# Patient Record
Sex: Female | Born: 1950
Health system: Southern US, Community
[De-identification: ages and names within clinical notes are randomized; demographics above are authoritative.]

## PROBLEM LIST (undated history)

## (undated) DIAGNOSIS — Z923 Personal history of irradiation: Secondary | ICD-10-CM

## (undated) DIAGNOSIS — C801 Malignant (primary) neoplasm, unspecified: Secondary | ICD-10-CM

## (undated) DIAGNOSIS — E079 Disorder of thyroid, unspecified: Secondary | ICD-10-CM

## (undated) DIAGNOSIS — D6800 Von Willebrand disease, unspecified: Secondary | ICD-10-CM

## (undated) HISTORY — PX: THYROIDECTOMY: SHX17

## (undated) HISTORY — PX: BREAST SURGERY: SHX581

## (undated) HISTORY — PX: ABDOMINAL HYSTERECTOMY: SHX81

## (undated) HISTORY — PX: OTHER SURGICAL HISTORY: SHX169

---

## 1998-11-08 ENCOUNTER — Ambulatory Visit (HOSPITAL_COMMUNITY): Admission: RE | Admit: 1998-11-08 | Discharge: 1998-11-08 | Payer: Self-pay | Admitting: Family Medicine

## 1998-11-08 ENCOUNTER — Encounter: Payer: Self-pay | Admitting: Family Medicine

## 1998-11-14 ENCOUNTER — Ambulatory Visit (HOSPITAL_COMMUNITY): Admission: RE | Admit: 1998-11-14 | Discharge: 1998-11-14 | Payer: Self-pay | Admitting: Family Medicine

## 1998-11-14 ENCOUNTER — Encounter: Payer: Self-pay | Admitting: Family Medicine

## 1999-07-07 ENCOUNTER — Other Ambulatory Visit: Admission: RE | Admit: 1999-07-07 | Discharge: 1999-07-07 | Payer: Self-pay | Admitting: Family Medicine

## 1999-08-04 ENCOUNTER — Ambulatory Visit (HOSPITAL_COMMUNITY): Admission: RE | Admit: 1999-08-04 | Discharge: 1999-08-04 | Payer: Self-pay | Admitting: Family Medicine

## 1999-08-04 ENCOUNTER — Encounter: Payer: Self-pay | Admitting: Family Medicine

## 2002-05-08 ENCOUNTER — Other Ambulatory Visit: Admission: RE | Admit: 2002-05-08 | Discharge: 2002-05-08 | Payer: Self-pay | Admitting: Obstetrics and Gynecology

## 2003-09-27 ENCOUNTER — Ambulatory Visit (HOSPITAL_COMMUNITY): Admission: RE | Admit: 2003-09-27 | Discharge: 2003-09-27 | Payer: Self-pay | Admitting: *Deleted

## 2003-09-27 ENCOUNTER — Encounter (INDEPENDENT_AMBULATORY_CARE_PROVIDER_SITE_OTHER): Payer: Self-pay | Admitting: Specialist

## 2003-09-28 ENCOUNTER — Other Ambulatory Visit: Admission: RE | Admit: 2003-09-28 | Discharge: 2003-09-28 | Payer: Self-pay | Admitting: Obstetrics and Gynecology

## 2006-12-20 ENCOUNTER — Encounter (INDEPENDENT_AMBULATORY_CARE_PROVIDER_SITE_OTHER): Payer: Self-pay | Admitting: Specialist

## 2006-12-20 ENCOUNTER — Encounter (INDEPENDENT_AMBULATORY_CARE_PROVIDER_SITE_OTHER): Payer: Self-pay | Admitting: Diagnostic Radiology

## 2006-12-20 ENCOUNTER — Encounter: Admission: RE | Admit: 2006-12-20 | Discharge: 2006-12-20 | Payer: Self-pay | Admitting: Obstetrics and Gynecology

## 2007-01-10 ENCOUNTER — Encounter: Admission: RE | Admit: 2007-01-10 | Discharge: 2007-01-10 | Payer: Self-pay | Admitting: Obstetrics and Gynecology

## 2007-02-10 ENCOUNTER — Ambulatory Visit (HOSPITAL_BASED_OUTPATIENT_CLINIC_OR_DEPARTMENT_OTHER): Admission: RE | Admit: 2007-02-10 | Discharge: 2007-02-10 | Payer: Self-pay | Admitting: General Surgery

## 2007-02-10 ENCOUNTER — Encounter (INDEPENDENT_AMBULATORY_CARE_PROVIDER_SITE_OTHER): Payer: Self-pay | Admitting: Specialist

## 2007-02-10 ENCOUNTER — Encounter: Admission: RE | Admit: 2007-02-10 | Discharge: 2007-02-10 | Payer: Self-pay | Admitting: General Surgery

## 2007-02-20 ENCOUNTER — Ambulatory Visit: Payer: Self-pay | Admitting: Oncology

## 2007-02-25 ENCOUNTER — Ambulatory Visit: Admission: RE | Admit: 2007-02-25 | Discharge: 2007-05-17 | Payer: Self-pay | Admitting: Radiation Oncology

## 2007-03-27 LAB — CBC WITH DIFFERENTIAL/PLATELET
BASO%: 0.4 % (ref 0.0–2.0)
LYMPH%: 35.7 % (ref 14.0–48.0)
MCHC: 35.2 g/dL (ref 32.0–36.0)
MONO#: 0.3 10*3/uL (ref 0.1–0.9)
MONO%: 5.5 % (ref 0.0–13.0)
Platelets: 305 10*3/uL (ref 145–400)
RBC: 4.79 10*6/uL (ref 3.70–5.32)
RDW: 13.9 % (ref 11.3–14.5)
WBC: 5.4 10*3/uL (ref 3.9–10.0)

## 2007-03-27 LAB — COMPREHENSIVE METABOLIC PANEL
ALT: 18 U/L (ref 0–35)
Alkaline Phosphatase: 99 U/L (ref 39–117)
Potassium: 3.5 mEq/L (ref 3.5–5.3)
Sodium: 141 mEq/L (ref 135–145)
Total Bilirubin: 0.4 mg/dL (ref 0.3–1.2)
Total Protein: 7.4 g/dL (ref 6.0–8.3)

## 2007-04-07 LAB — PROTHROMBIN TIME: INR: 0.9 (ref 0.0–1.5)

## 2007-04-17 ENCOUNTER — Ambulatory Visit: Payer: Self-pay | Admitting: Oncology

## 2007-04-23 LAB — OTHER SOLSTAS TEST

## 2007-04-24 ENCOUNTER — Emergency Department (HOSPITAL_COMMUNITY): Admission: EM | Admit: 2007-04-24 | Discharge: 2007-04-24 | Payer: Self-pay | Admitting: Emergency Medicine

## 2007-04-24 LAB — VON WILLEBRAND FACTOR MULTIMER: Factor-VIII Activity: 36 % — ABNORMAL LOW (ref 75–150)

## 2007-05-21 ENCOUNTER — Other Ambulatory Visit: Admission: RE | Admit: 2007-05-21 | Discharge: 2007-05-21 | Payer: Self-pay | Admitting: Family Medicine

## 2007-06-25 ENCOUNTER — Ambulatory Visit: Payer: Self-pay | Admitting: Oncology

## 2007-12-10 ENCOUNTER — Ambulatory Visit: Payer: Self-pay | Admitting: Oncology

## 2007-12-12 ENCOUNTER — Encounter: Admission: RE | Admit: 2007-12-12 | Discharge: 2007-12-12 | Payer: Self-pay | Admitting: Surgery

## 2007-12-15 LAB — VON WILLEBRAND PANEL
Factor-VIII Activity: 127 % (ref 75–150)
Ristocetin-Cofactor: <20 % — ABNORMAL LOW (ref 50–150)

## 2007-12-23 ENCOUNTER — Encounter: Admission: RE | Admit: 2007-12-23 | Discharge: 2007-12-23 | Payer: Self-pay | Admitting: Surgery

## 2008-01-08 ENCOUNTER — Encounter (INDEPENDENT_AMBULATORY_CARE_PROVIDER_SITE_OTHER): Payer: Self-pay | Admitting: Surgery

## 2008-01-09 ENCOUNTER — Ambulatory Visit: Payer: Self-pay | Admitting: Oncology

## 2008-01-09 ENCOUNTER — Inpatient Hospital Stay (HOSPITAL_COMMUNITY): Admission: RE | Admit: 2008-01-09 | Discharge: 2008-01-10 | Payer: Self-pay | Admitting: Surgery

## 2008-02-10 ENCOUNTER — Encounter (HOSPITAL_COMMUNITY): Admission: RE | Admit: 2008-02-10 | Discharge: 2008-02-27 | Payer: Self-pay | Admitting: Endocrinology

## 2008-03-16 IMAGING — US US SOFT TISSUE HEAD/NECK
1 series · 14 of 25 positions shown · non-contrast
Comparison: NONE

CLINICAL DATA: Goiter noted on the recent CT study. 

THYROID ULTRASOUND

[Series 1: us thyroid · 0.12mm/px · 14 of 38 slices shown]
[im 1/38]
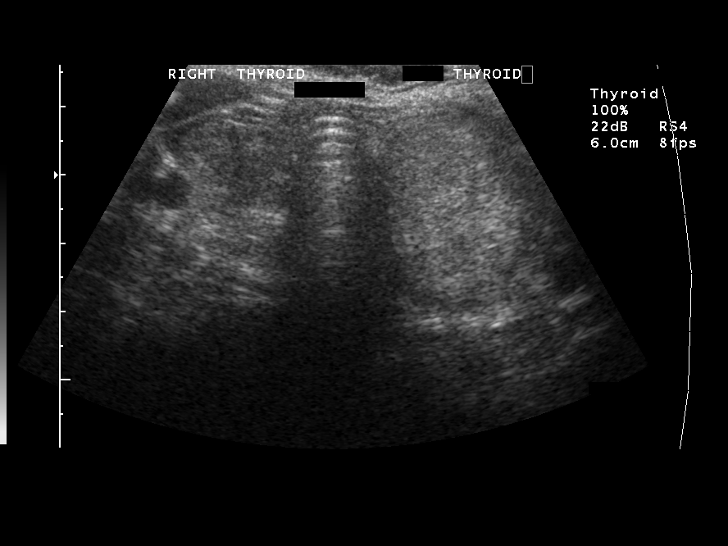
[im 4/38]
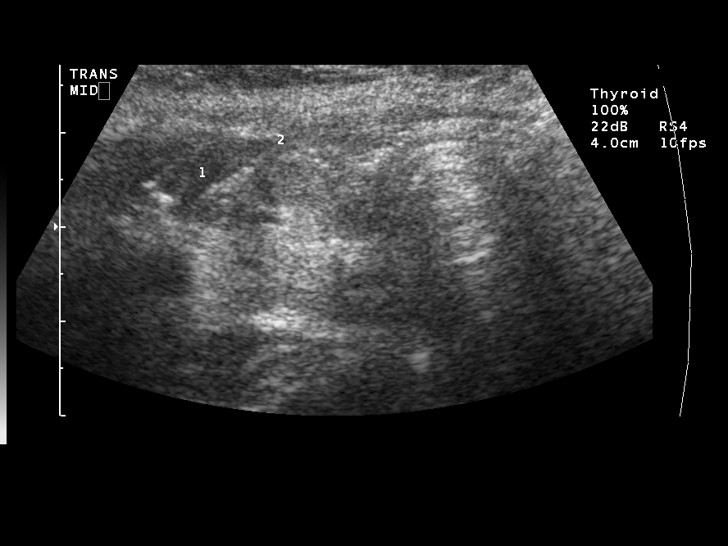
[im 7/38]
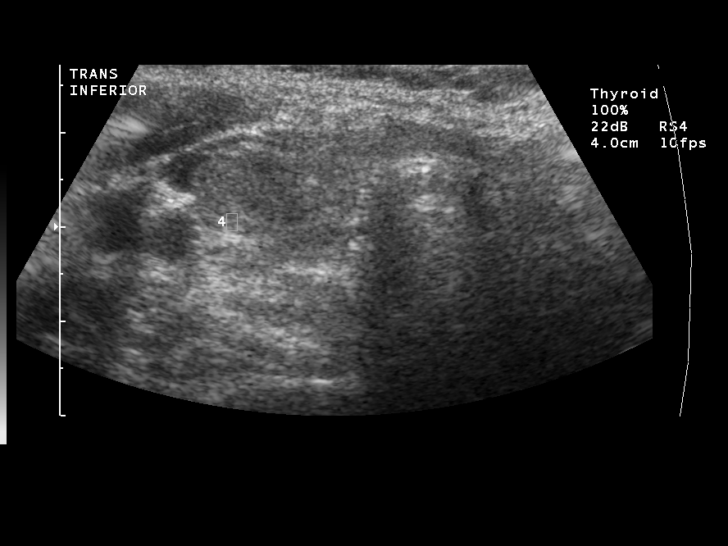
[im 10/38]
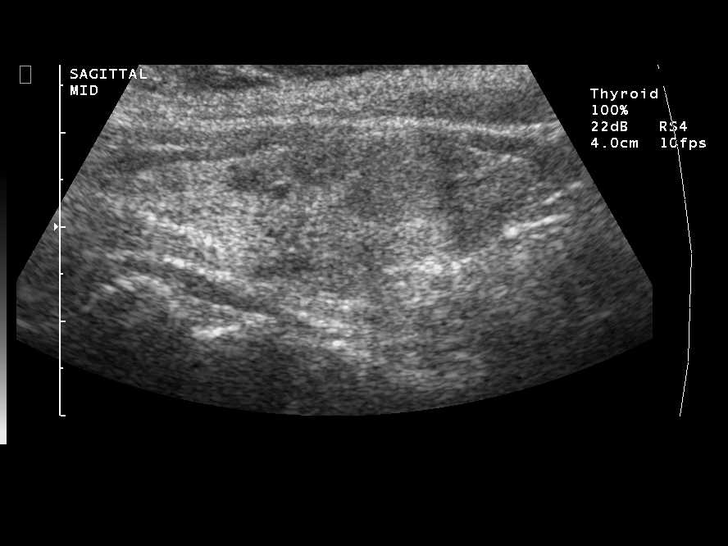
[im 13/38]
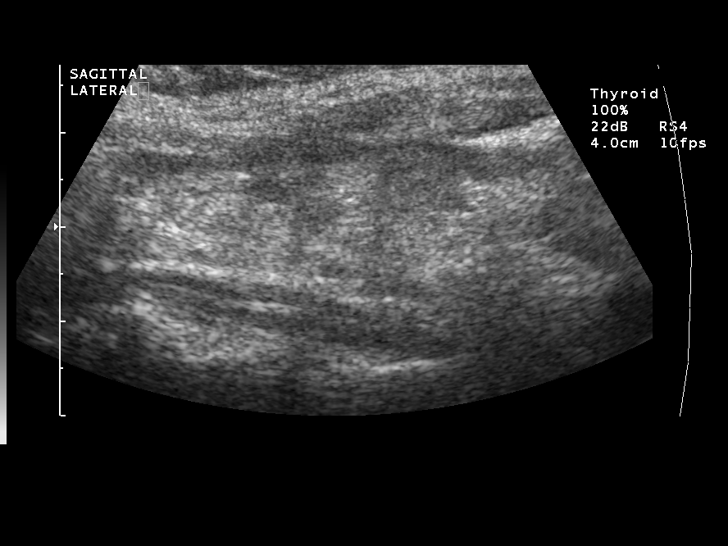
[im 14/38]
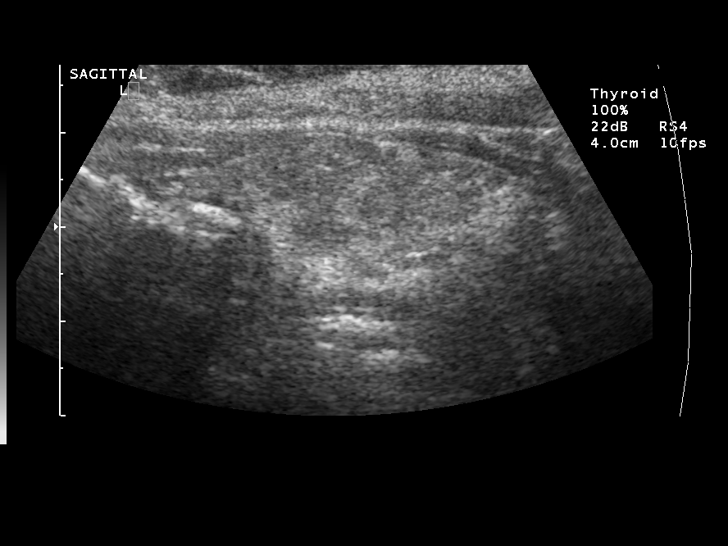
[im 17/38]
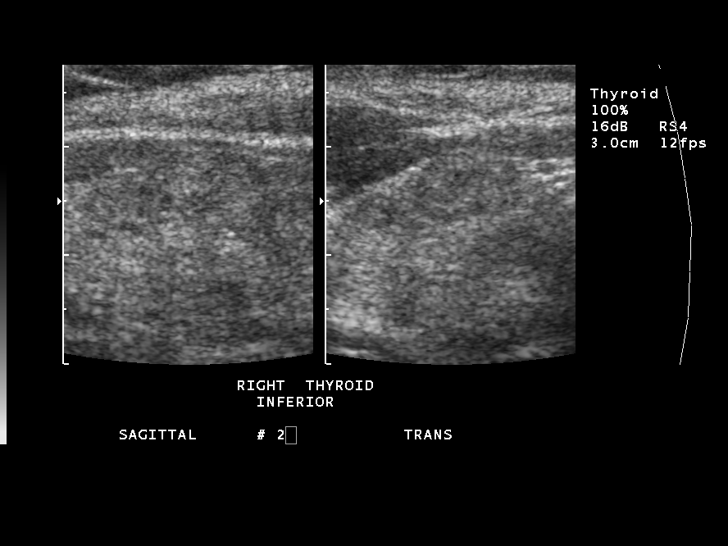
[im 21/38]
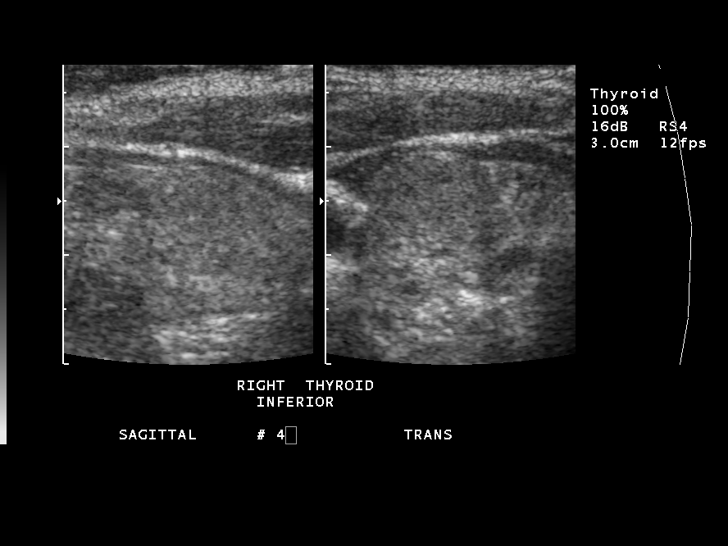
[im 24/38]
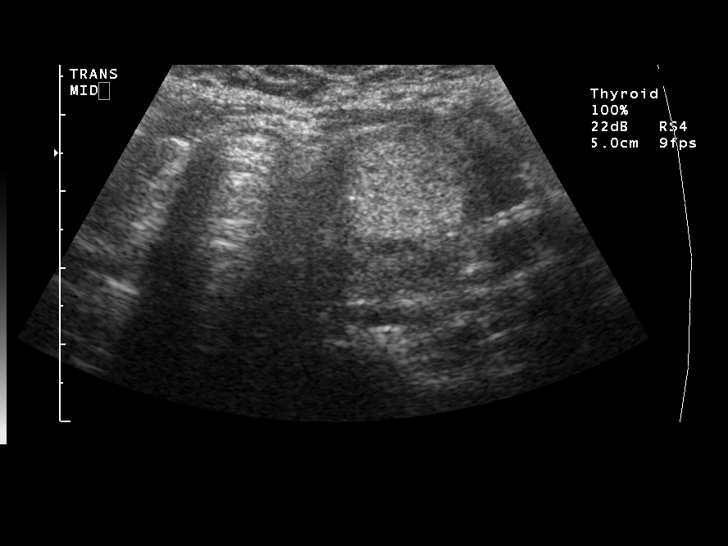
[im 25/38]
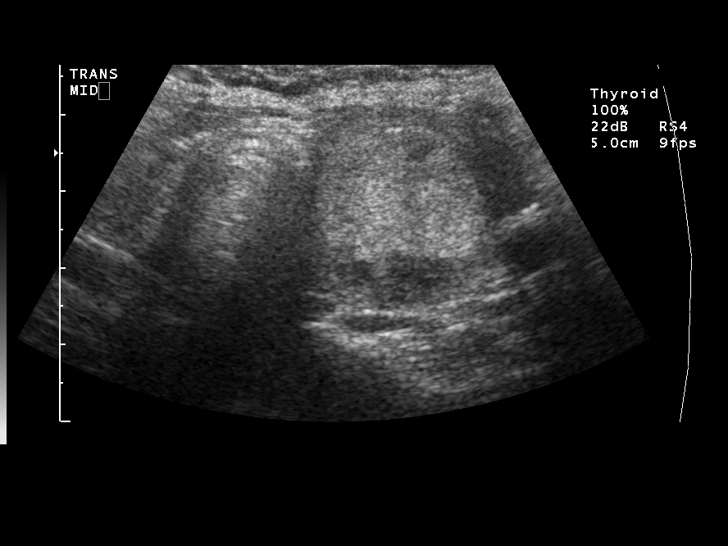
[im 28/38]
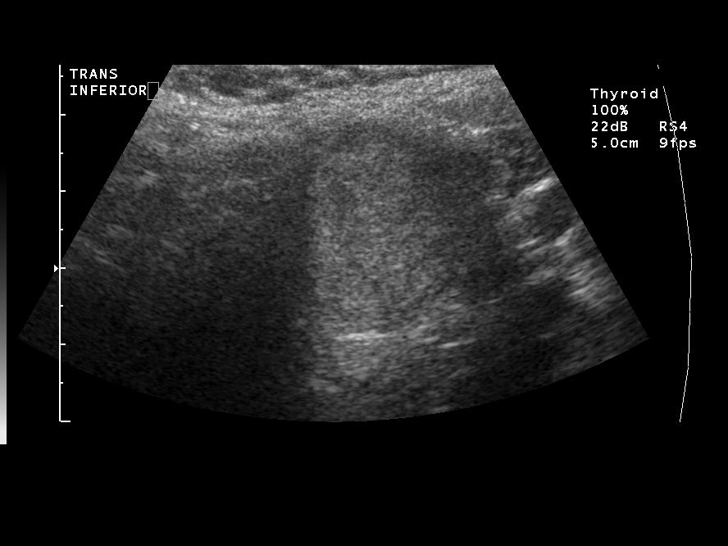
[im 31/38]
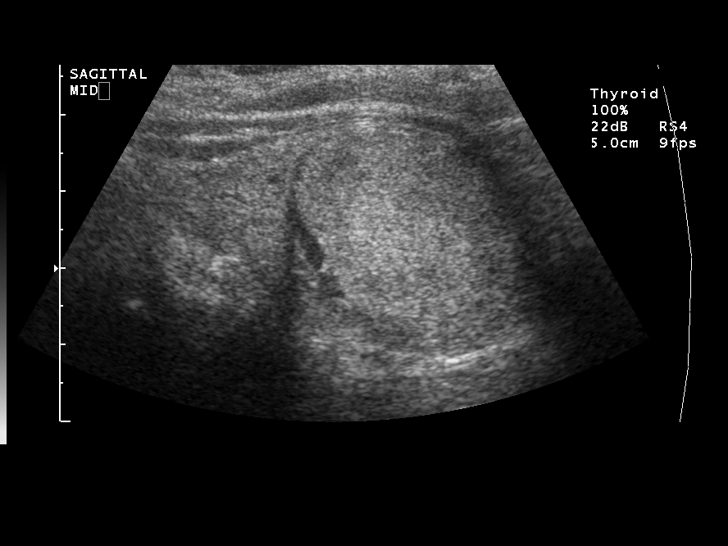
[im 34/38]
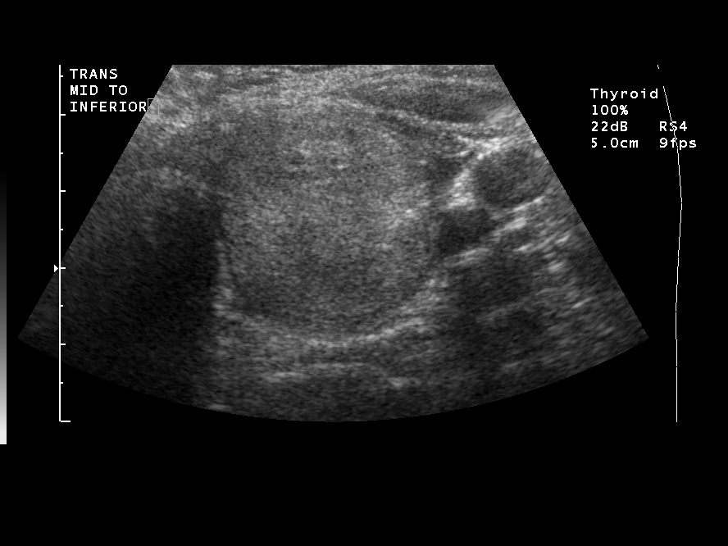
[im 38/38]
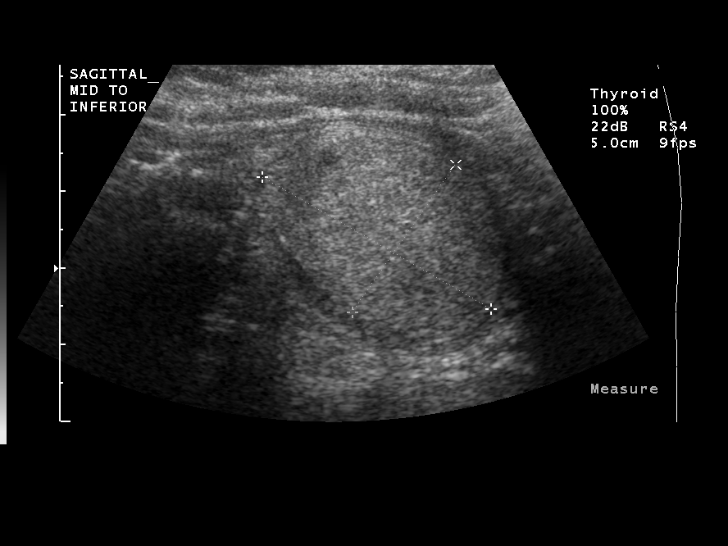

[14 of 25 positions shown; findings below may reference images not displayed]

FINDINGS: There is no prior CT or ultrasound available for 
comparison. The isthmus measures 3.8 mm. Right lobe: The right 
lobe measures approximately 4.6 x 1.5 x 1.7 cm. There is a 
heterogeneous parenchymal echogenicity seen. There are areas that 
are relatively hypoechoic in the mid to lower pole. There is an 
isoechoic nodule seen in the lower pole that measures 
approximately 9 x 5 x 8 mm. A second hypoechoic nodule is seen in 
the lower pole that measures 11 x 6 x 11 mm. Left lobe: The left 
lobe measures 5.6 x 3.1 x 2.3 cm. There is a large solid nodule 
seen involving the mid to lower pole. This measures 3.4 x 2.4 x 
2.9 cm.
IMPRESSION: Thyromegaly. Heterogeneous right lobe with isoechoic 
solid nodule noted in the lower pole and a hypoechoic nodule also 
noted in the lower pole. Large solid nodule seen in the mid to 
lower pole of the left lobe of the thyroid. Yoshida, Moses Date: 05/01/2007 DAS  JEC

## 2009-01-04 ENCOUNTER — Encounter (INDEPENDENT_AMBULATORY_CARE_PROVIDER_SITE_OTHER): Payer: Self-pay | Admitting: Diagnostic Radiology

## 2009-01-04 ENCOUNTER — Encounter: Admission: RE | Admit: 2009-01-04 | Discharge: 2009-01-04 | Payer: Self-pay | Admitting: Obstetrics and Gynecology

## 2009-06-13 ENCOUNTER — Encounter (HOSPITAL_COMMUNITY): Admission: RE | Admit: 2009-06-13 | Discharge: 2009-08-29 | Payer: Self-pay | Admitting: Endocrinology

## 2009-06-21 ENCOUNTER — Encounter (INDEPENDENT_AMBULATORY_CARE_PROVIDER_SITE_OTHER): Payer: Self-pay | Admitting: Surgery

## 2009-06-21 ENCOUNTER — Ambulatory Visit (HOSPITAL_BASED_OUTPATIENT_CLINIC_OR_DEPARTMENT_OTHER): Admission: RE | Admit: 2009-06-21 | Discharge: 2009-06-22 | Payer: Self-pay | Admitting: Surgery

## 2009-06-21 ENCOUNTER — Encounter: Admission: RE | Admit: 2009-06-21 | Discharge: 2009-06-21 | Payer: Self-pay | Admitting: Surgery

## 2010-08-16 ENCOUNTER — Encounter: Admission: RE | Admit: 2010-08-16 | Discharge: 2010-08-16 | Payer: Self-pay | Admitting: Obstetrics and Gynecology

## 2011-03-04 LAB — POCT HEMOGLOBIN-HEMACUE: Hemoglobin: 14.1 g/dL (ref 12.0–15.0)

## 2011-04-10 NOTE — Op Note (Signed)
Alexis [name], [name]               ACCOUNT NO.:  [id number]   MEDICAL RECORD NO.:  [id number]          PATIENT TYPE:  AMB   LOCATION:  DAY                          FACILITY:  [facility]   PHYSICIAN:  Velora Heckler, MD      DATE OF BIRTH:  1951/07/22   DATE OF PROCEDURE:  01/08/2008  DATE OF DISCHARGE:                               OPERATIVE REPORT   PREOPERATIVE DIAGNOSIS:  Left thyroid nodule with cytologic atypia.   POSTOPERATIVE DIAGNOSIS:  Left thyroid nodule with cytologic atypia.   PROCEDURE:  Total thyroidectomy.   SURGEON:  Velora Heckler, MD, FACS   ASSISTANT:  Currie Paris, M.D., FACS   ANESTHESIA:  General.   ESTIMATED BLOOD LOSS:  Minimal.   PREPARATION:  Betadine.   COMPLICATIONS:  None.   INDICATIONS:  The patient is a 60 year old white female from [place],  [place].  She presented with a newly-diagnosed thyroid nodule.  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  The patient was seen by endocrinology  and referred to our practice for resection for definitive diagnosis.   BODY OF REPORT:  The procedure was done in OR #11 at the [facility].  The patient is brought to the operating room,  placed in a supine position on the operating room table.  Following  administration of general anesthesia, the patient is positioned and then  prepped and draped in usual strict aseptic fashion.  After ascertaining  that an adequate level of anesthesia had been obtained, a Kocher  incision is made with a #15 blade.  Dissection is carried down through  subcutaneous tissues and platysma.  Hemostasis is obtained with the  electrocautery.  Skin flaps are elevated cephalad and caudad from the  thyroid notch to the sternal notch.  A Mahorner self-retaining retractor  is placed for exposure.  Strap muscles are incised in  the midline and  dissection is begun on the left side.  Strap muscles are reflected  laterally.  Left lobe is exposed.  It is moderately enlarged.  Venous  tributaries are divided between small and medium Ligaclips with the  harmonic scalpel.  Parathyroid tissue is identified and dissected off on  its vascular pedicle and preserved.  Superior pole vessels are ligated  in continuity with 2-0 silk ties and medium Ligaclips and divided with  the harmonic scalpel.  Gland is rolled anteriorly.  Branches of the  inferior thyroid artery are divided between small Ligaclips with the  harmonic scalpel.  Inferior venous tributaries are divided between  medium Ligaclips with the harmonic scalpel.  Recurrent laryngeal nerve  is identified and preserved.  Ligament of Allyson Sabal is transected with  electrocautery and the gland is mobilized up and onto the anterior  trachea.  Isthmus is mobilized across the midline.  There is no  significant pyramidal lobe identified.  A dry pack is placed in the left  neck.   Next we turned our attention to the right side.  Right thyroid lobe is  exposed by reflecting the strap muscles laterally.  The right lobe is  small but contains at least a single nodule measuring slightly greater  than 1 cm and several small nodules measuring less than 1 cm.  Venous  tributaries are divided between Ligaclips with the harmonic scalpel.  Superior pole is dissected out, ligated in continuity with 2-0 silk  ties, and divided with the harmonic scalpel.  Inferior venous  tributaries are divided between Ligaclips with the harmonic scalpel.  Gland is rolled anteriorly.  Parathyroid tissue is dissected out and  maintained on its vascular pedicle.  Recurrent nerve is identified and  preserved.  Branches of the inferior thyroid artery are divided between  small Ligaclips with the harmonic scalpel.  Ligament of Allyson Sabal is  transected and a small remnant of thyroid was left in situ adjacent to  the  recurrent nerve on the right.  Remainder of the gland is mobilized  up and onto the anterior trachea and is excised off the trachea using  the harmonic scalpel for hemostasis.  Neck is irrigated with warm  saline.  Good hemostasis is noted bilaterally.  Surgicel was placed in  the operative field bilaterally.  Strap muscles are reapproximated in  the midline with interrupted 3-0 Vicryl sutures.  Platysma is closed  with interrupted 3-0 Vicryl sutures.  Skin is closed with a running 4-0  Monocryl subcuticular suture.  Wound is washed and dried and Benzoin and  Steri-Strips are applied.  Sterile dressings were applied.  The patient  is awakened from anesthesia and brought to the recovery room in stable  condition.  The patient tolerated the procedure well.      Velora Heckler, MD  Electronically Signed     TMG/MEDQ  D:  01/08/2008  T:  01/09/2008  Job:  [id number]   cc:   Stacie Acres. Cliffton Asters, M.D.  Fax: [phone]   Dorisann Frames, M.D.  Fax: [phone]   Pierce Crane, MD  Fax: [phone]   Guy Sandifer. Henderson Cloud, M.D.  Fax: [phone]

## 2011-04-10 NOTE — Op Note (Signed)
[name], Alexis               ACCOUNT NO.:  [id number]   MEDICAL RECORD NO.:  [id number]          PATIENT TYPE:  AMB   LOCATION:  DSC                          FACILITY:  [facility]   PHYSICIAN:  Velora Heckler, MD      DATE OF BIRTH:  1951-10-03   DATE OF PROCEDURE:  06/21/2009  DATE OF DISCHARGE:                               OPERATIVE REPORT   PREOPERATIVE DIAGNOSIS:  Intraductal papilloma, right breast.   POSTOPERATIVE DIAGNOSIS:  Intraductal papilloma, right breast.   PROCEDURE:  Right breast excisional biopsy with wire localization.   SURGEON:  Velora Heckler, MD, FACS   ANESTHESIA:  General per Dr. Bedelia Person.   ESTIMATED BLOOD LOSS:  Minimal.   PREPARATION:  ChloraPrep.   COMPLICATIONS:  None.   INDICATIONS:  The patient is a 60 year old white female, well known to  my surgical practice.  A screening mammogram in February 2010 at [facility] showed a right breast mass.  Ultrasound-guided core  needle biopsy showed findings consistent with intraductal papilloma.  The patient now comes to surgery for wire localized excision.   BODY OF REPORT:  Procedure was done in OR #6 at the [facility].  The patient was brought to the operating room, placed in a  supine position on the operating room table.  Following the  administration of general anesthesia, the patient was prepped and draped  in the usual strict aseptic fashion.  After ascertaining that an  adequate level of anesthesia had been achieved, the skin around the base  of the wire in the right lateral breast was anesthetized with local  anesthetic.  A curvilinear incision was made with a #15 blade.  Dissection was carried down into subcutaneous tissues and hemostasis  obtained with the electrocautery.  Using the electrocautery for  dissection, the massive tissue around the guidewire was excised down to  the chest wall.  The specimen was completely excised, and a specimen  mammogram was  performed, which confirms that the lesions in question are  present within the mass.  This was submitted to pathology for review.   Good hemostasis was obtained throughout the wound.  Subcutaneous tissues  were closed with interrupted 3-0 Vicryl sutures.  Skin was closed with  running 4-0 Vicryl subcuticular suture.  Wound was washed and dried, and  Benzoin and Steri-Strips were applied.  Sterile dressings were applied.  The patient was awakened from anesthesia and brought to the recovery  room in stable condition.  The patient tolerated the procedure well.      Velora Heckler, MD  Electronically Signed     TMG/MEDQ  D:  06/21/2009  T:  06/21/2009  Job:  [id number]   cc:   Guy Sandifer. Henderson Cloud, M.D.  Daryl Eastern, M.D.  Dorisann Frames, M.D.

## 2011-04-13 NOTE — Op Note (Signed)
Alexis [name], Alexis [name]               ACCOUNT NO.:  [id number]   MEDICAL RECORD NO.:  [id number]          PATIENT TYPE:  AMB   LOCATION:  DSC                          FACILITY:  [facility]   PHYSICIAN:  Rose Phi. Maple Hudson, M.D.   DATE OF BIRTH:  14-Nov-1951   DATE OF PROCEDURE:  02/10/2007  DATE OF DISCHARGE:                               OPERATIVE REPORT   PREOPERATIVE DIAGNOSIS:  Ductal carcinoma in situ of the left breast.   POSTOPERATIVE DIAGNOSIS:  Ductal carcinoma in situ of the left breast.   OPERATION:  Left partial mastectomy with needle localization and  specimen mammogram.   SURGEON:  Rose Phi. Maple Hudson, M.D.   ANESTHESIA:  General.   OPERATIVE PROCEDURE:  Prior to coming to the operating room, a  localizing wire had been placed in the left breast at the area of the  microcalcifications where the biopsy had shown DCIS.   After suitable general anesthesia was induced, the patient was placed in  a supine position with arms extended on the arm board.  The left breast  prepped and draped in the usual fashion.  The wire was coming in from  the lateral side at about the 4 o'clock position, so I designed a radial  incision to incorporate the wire and then infiltrated the skin with  0.25% Marcaine with adrenaline.  I then made the radial incision  extending onto the areola to the base of the nipple.  I exposed the wire  and then widely excised the wire and surrounding tissue.  Specimen  mammography confirmed the removal of the clip as well as the  calcifications.  Just medial, there was a nodular tissue that I excised  also and I think represented hematoma from the original needle biopsy.   I then thoroughly infiltrated the incision with 0.25% Marcaine.  We had  good hemostasis.  The deeper layer was closed with 3-0 Vicryl and skin  with a subcuticular 4-0 Monocryl and Dermabond.   A light dressing was applied and the patient transferred to the recovery  room in satisfactory condition,  having tolerated procedure well.      Rose Phi. Maple Hudson, M.D.  Electronically Signed     PRY/MEDQ  D:  02/10/2007  T:  02/11/2007  Job:  [id number]

## 2011-05-24 ENCOUNTER — Other Ambulatory Visit (HOSPITAL_COMMUNITY): Payer: Self-pay | Admitting: Endocrinology

## 2011-05-24 DIAGNOSIS — C73 Malignant neoplasm of thyroid gland: Secondary | ICD-10-CM

## 2011-06-18 ENCOUNTER — Encounter (HOSPITAL_COMMUNITY)
Admission: RE | Admit: 2011-06-18 | Discharge: 2011-06-18 | Disposition: A | Discharge status: Home / Self Care | Payer: BC Managed Care – PPO | Source: Ambulatory Visit | Attending: Endocrinology | Admitting: Endocrinology

## 2011-06-18 DIAGNOSIS — E0789 Other specified disorders of thyroid: Secondary | ICD-10-CM | POA: Insufficient documentation

## 2011-06-18 DIAGNOSIS — C73 Malignant neoplasm of thyroid gland: Secondary | ICD-10-CM | POA: Insufficient documentation

## 2011-06-19 ENCOUNTER — Encounter (HOSPITAL_COMMUNITY)
Admission: RE | Admit: 2011-06-19 | Discharge: 2011-06-19 | Disposition: A | Discharge status: Home / Self Care | Payer: BC Managed Care – PPO | Source: Ambulatory Visit | Attending: Endocrinology | Admitting: Endocrinology

## 2011-06-20 ENCOUNTER — Encounter (HOSPITAL_COMMUNITY)
Admission: RE | Admit: 2011-06-20 | Discharge: 2011-06-20 | Disposition: A | Discharge status: Home / Self Care | Payer: BC Managed Care – PPO | Source: Ambulatory Visit | Attending: Endocrinology | Admitting: Endocrinology

## 2011-06-22 ENCOUNTER — Encounter (HOSPITAL_COMMUNITY)
Admission: RE | Admit: 2011-06-22 | Discharge: 2011-06-22 | Disposition: A | Discharge status: Home / Self Care | Payer: BC Managed Care – PPO | Source: Ambulatory Visit | Attending: Endocrinology | Admitting: Endocrinology

## 2011-06-22 MED ORDER — SODIUM IODIDE I 131 CAPSULE
4.0000 | Freq: Once | INTRAVENOUS | Status: AC | PRN
  Administered 2011-06-22: 4 via ORAL

## 2011-08-17 LAB — PROTIME-INR: INR: 0.9

## 2011-08-17 LAB — URINE MICROSCOPIC-ADD ON

## 2011-08-17 LAB — CBC
MCHC: 34.8
MCV: 83.8
Platelets: 359
RDW: 13.7

## 2011-08-17 LAB — URINALYSIS, ROUTINE W REFLEX MICROSCOPIC
Nitrite: NEGATIVE
Specific Gravity, Urine: 1.013
Urobilinogen, UA: 0.2

## 2011-08-17 LAB — DIFFERENTIAL
Basophils Absolute: 0
Basophils Relative: 0
Eosinophils Absolute: 0.1
Neutro Abs: 4.2
Neutrophils Relative %: 60

## 2011-08-17 LAB — BASIC METABOLIC PANEL
BUN: 15
CO2: 31
Calcium: 10.3
Chloride: 107
Creatinine, Ser: 0.64
GFR calc Af Amer: >60
Glucose, Bld: 94

## 2011-08-17 LAB — CALCIUM
Calcium: 7.9 — ABNORMAL LOW
Calcium: 7.6 — ABNORMAL LOW

## 2013-03-09 ENCOUNTER — Telehealth: Payer: Self-pay | Admitting: *Deleted

## 2013-03-09 NOTE — Telephone Encounter (Signed)
Pt called stating that she needed to get a new oncologist to see her for her blood disorder.  Told her that I would send her information to the correct people and someone would get back in touch with her to schedule an appt.

## 2013-04-24 ENCOUNTER — Telehealth: Payer: Self-pay | Admitting: *Deleted

## 2013-04-24 NOTE — Telephone Encounter (Signed)
Pt informed that she will need to be referred back to [facility] by her PCP if she should need further care from our oncologists d/t Dr. Renelda Loma departure.

## 2015-08-24 ENCOUNTER — Other Ambulatory Visit: Payer: Self-pay | Admitting: Obstetrics and Gynecology

## 2015-08-24 DIAGNOSIS — N6320 Unspecified lump in the left breast, unspecified quadrant: Secondary | ICD-10-CM

## 2015-08-29 ENCOUNTER — Ambulatory Visit
Admission: RE | Admit: 2015-08-29 | Discharge: 2015-08-29 | Disposition: A | Discharge status: Home / Self Care | Payer: 59 | Source: Ambulatory Visit | Attending: Obstetrics and Gynecology | Admitting: Obstetrics and Gynecology

## 2015-08-29 DIAGNOSIS — N6320 Unspecified lump in the left breast, unspecified quadrant: Secondary | ICD-10-CM

## 2015-08-30 ENCOUNTER — Other Ambulatory Visit: Payer: Self-pay

## 2015-11-24 ENCOUNTER — Other Ambulatory Visit: Payer: Self-pay

## 2015-11-24 ENCOUNTER — Telehealth: Payer: Self-pay

## 2015-11-24 NOTE — Telephone Encounter (Signed)
Received call from pt who was a former patient of Dr Truddie Coco. Upon Dr Julien Girt departure from [facility], pt was assigned to Dr Marin Olp to be seen on a prn basis for DCIS and/or Von Willebrands. Has not had any problems since Dr Truddie Coco left, but has currently been noticing an increase in unexplained bruising.   Spoke with Dr Marin Olp and Lexine Baton, manager who agree pt does not need a new referral to the practice but can be scheduled as a new patient with Dr Ginette Pitman. Pt is aware his first available is 4-6 weeks from now and to contact PCP should sx worsen.

## 2015-12-28 ENCOUNTER — Other Ambulatory Visit (HOSPITAL_BASED_OUTPATIENT_CLINIC_OR_DEPARTMENT_OTHER): Payer: 59

## 2015-12-28 ENCOUNTER — Encounter: Payer: Self-pay | Admitting: Hematology & Oncology

## 2015-12-28 ENCOUNTER — Ambulatory Visit: Payer: 59

## 2015-12-28 ENCOUNTER — Ambulatory Visit (HOSPITAL_BASED_OUTPATIENT_CLINIC_OR_DEPARTMENT_OTHER): Payer: 59 | Admitting: Hematology & Oncology

## 2015-12-28 VITALS — BP 136/75 | HR 68 | Temp 98.0°F | Resp 18 | Ht 60.0 in | Wt 135.0 lb

## 2015-12-28 DIAGNOSIS — D680 Von Willebrand's disease: Secondary | ICD-10-CM

## 2015-12-28 DIAGNOSIS — Z86000 Personal history of in-situ neoplasm of breast: Secondary | ICD-10-CM | POA: Diagnosis not present

## 2015-12-28 DIAGNOSIS — D6801 Von willebrand disease, type 1: Secondary | ICD-10-CM

## 2015-12-28 DIAGNOSIS — D0511 Intraductal carcinoma in situ of right breast: Secondary | ICD-10-CM

## 2015-12-28 LAB — CBC WITH DIFFERENTIAL (CANCER CENTER ONLY)
BASO#: 0.1 10*3/uL (ref 0.0–0.2)
BASO%: 1 % (ref 0.0–2.0)
EOS%: 1.8 % (ref 0.0–7.0)
Eosinophils Absolute: 0.1 10*3/uL (ref 0.0–0.5)
HEMATOCRIT: 44.1 % (ref 34.8–46.6)
HGB: 14.8 g/dL (ref 11.6–15.9)
LYMPH#: 1.8 10*3/uL (ref 0.9–3.3)
LYMPH%: 37.1 % (ref 14.0–48.0)
MCH: 29.5 pg (ref 26.0–34.0)
MCHC: 33.6 g/dL (ref 32.0–36.0)
MCV: 88 fL (ref 81–101)
MONO#: 0.4 10*3/uL (ref 0.1–0.9)
MONO%: 9 % (ref 0.0–13.0)
NEUT#: 2.5 10*3/uL (ref 1.5–6.5)
NEUT%: 51.1 % (ref 39.6–80.0)
PLATELETS: 256 10*3/uL (ref 145–400)
RBC: 5.02 10*6/uL (ref 3.70–5.32)
RDW: 13.6 % (ref 11.1–15.7)
WBC: 4.9 10*3/uL (ref 3.9–10.0)

## 2015-12-28 LAB — COMPREHENSIVE METABOLIC PANEL
ALT: 13 U/L (ref 0–55)
ANION GAP: 10 meq/L (ref 3–11)
AST: 17 U/L (ref 5–34)
Albumin: 4.4 g/dL (ref 3.5–5.0)
Alkaline Phosphatase: 74 U/L (ref 40–150)
BUN: 17.3 mg/dL (ref 7.0–26.0)
CALCIUM: 9.3 mg/dL (ref 8.4–10.4)
CHLORIDE: 104 meq/L (ref 98–109)
CO2: 24 meq/L (ref 22–29)
Creatinine: 0.8 mg/dL (ref 0.6–1.1)
EGFR: 78 mL/min/{1.73_m2} — AB (ref >90)
Glucose: 91 mg/dl (ref 70–140)
POTASSIUM: 3.8 meq/L (ref 3.5–5.1)
Sodium: 139 mEq/L (ref 136–145)
Total Bilirubin: 0.75 mg/dL (ref 0.20–1.20)
Total Protein: 7.7 g/dL (ref 6.4–8.3)

## 2015-12-28 NOTE — Progress Notes (Signed)
Referral MD  Reason for Referral: Von Willebrand disease - Type 1A     Chief Complaint  Patient presents with  . OTHER    New Patient  :  I have been bruising lately.  HPI:  Alexis [name] is a very nice 65 year old white female. She was seen 7 years ago by Dr. Truddie Coco for. Apparently, she had bleeding when she had surgery initially. As when she was found to have a Willebrand disease.  She has a history of DCIS of the left breast. She was found to have this back in March 2008. She underwent lumpectomy followed by radiation. She did not need any tamoxifen.  Again, she was found to have a Willebrand disease. As always, Dr. Audelia Hives did a very thorough workup. And gave her a DDAVP which increased her von Willebrand levels and got her through thyroidectomy for thyroid cancer.  Of note, she has 5 kids. As far she knows, she's never had any bleeding with delivery. This is not surprising since followed by levels go up nicely during pregnancy.   She did have a hysterectomy but had no obvious bleeding with this.   She is working. She's having no problems with work. She's noticed some easy bruising lately. She is not taking aspirin. She does take some Motrin.   She's had no obvious bleeding. There's been no hematuria. His been no melena or bright red blood per rectum. She's had no bleeding from the gums. She's had no hemoptysis.  A sister has von Willebrand disease. She's not sure which of her children has von Willebrand disease.  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 She was actually in [place] for a big dance competition. She got first place.   She does not smoke. She has a rare drink of alcohol.  She's had no change in medications.  She is not aware of any upcoming surgeries.  Overall, her performance status is ECOG 0.  No past medical history on file.:  No past surgical history on file.:   Current outpatient prescriptions:  .  levothyroxine  (SYNTHROID, LEVOTHROID) 125 MCG tablet, Take 125 mcg by mouth daily before breakfast., Disp: , Rfl: :  :  Not on File:  No family history on file.:  Social History   Social History  . Marital Status: Married    Spouse Name: N/A  . Number of Children: N/A  . Years of Education: N/A   Occupational History  . Not on file.   Social History Main Topics  . Smoking status: Former Research scientist (life sciences)  . Smokeless tobacco: Not on file     Comment: Quit 40 years ago  . Alcohol Use: Not on file  . Drug Use: Not on file  . Sexual Activity: Not on file   Other Topics Concern  . Not on file   Social History Narrative  . No narrative on file  :  Pertinent items are noted in HPI.  Exam: @IPVITALS @  petite white female in no obvious distress. Vital signs show a temperature of 98. Pulse 60. Blood pressure 136/75. Weight is 135 pounds. Head and neck exam shows no ocular or oral lesions. There are no palpable cervical or supraclavicular lymph nodes. Lungs are clear. Cardiac exam regular rate and rhythm with no murmurs, rubs or bruits. Abdomen is soft. She has good bowel sounds. There is no fluid wave. There is no palpable liver or spleen tip. Back exam shows no tenderness over the  spine, ribs or hips. Extremities shows no clubbing, cyanosis or edema. Neurological exam shows no focal neurological deficits. Skin exam shows no rashes, ecchymoses or petechia.    Recent Labs  12/28/15 0956  WBC 4.9  HGB 14.8  HCT 44.1  PLT 256   No results for input(s): NA, K, CL, CO2, GLUCOSE, BUN, CREATININE, CALCIUM in the last 72 hours.  Blood smear review:  Normochromic and normocytic red blood cells. There are no nucleated red blood cells. I see no schistocytes. White cells per normal morphology maturation. Platelets are adequate in number and size. Platelets are well granulated.   Pathology: None     Assessment and Plan:  Alexis [name] is a very charming 65 year old white female. She has a Willebrand  disease. Looks like she has type IA.  I think that she may have some bruising on occasion. She's had no obvious bleeding. I think this is more important.  I told her to try some vitamin C. Sometimes in postmenopausal women, vitamin C get help with bruising.  We will see what her von Willebrand levels are. I would not think that she would need any type of supplementation.  As nice as she is, I just don't think we have to get her back to see [place]. She being followed closely by her primary care doctor. She certainly knows to come back to see [place] if she has any upcoming surgery so that we can give DDAVP to help supplement her a Willebrand levels.  I spent about 45 minutes with her. She Alexis [name] is very knowledgeable about all Willebrand disease.

## 2015-12-29 LAB — VON WILLEBRAND PANEL
Factor VIII Activity: 24 % — ABNORMAL LOW (ref 57–163)
PDF Image: 0
VON WILLEBRAND AG: 12 % — AB (ref 50–200)
VON WILLEBRAND FACTOR: 13 % — AB (ref 50–200)

## 2015-12-29 LAB — APTT: APTT: 32 s (ref 24–33)

## 2016-06-25 ENCOUNTER — Encounter (HOSPITAL_COMMUNITY): Payer: Self-pay | Admitting: Emergency Medicine

## 2016-06-25 ENCOUNTER — Emergency Department (HOSPITAL_COMMUNITY)
Admission: EM | Admit: 2016-06-25 | Discharge: 2016-06-25 | Disposition: A | Discharge status: Home / Self Care | Payer: 59 | Attending: Emergency Medicine | Admitting: Emergency Medicine

## 2016-06-25 ENCOUNTER — Emergency Department (HOSPITAL_COMMUNITY): Payer: 59

## 2016-06-25 DIAGNOSIS — R509 Fever, unspecified: Secondary | ICD-10-CM | POA: Diagnosis present

## 2016-06-25 DIAGNOSIS — Z7982 Long term (current) use of aspirin: Secondary | ICD-10-CM | POA: Diagnosis not present

## 2016-06-25 DIAGNOSIS — Z85850 Personal history of malignant neoplasm of thyroid: Secondary | ICD-10-CM | POA: Insufficient documentation

## 2016-06-25 DIAGNOSIS — R5081 Fever presenting with conditions classified elsewhere: Secondary | ICD-10-CM | POA: Insufficient documentation

## 2016-06-25 DIAGNOSIS — R197 Diarrhea, unspecified: Secondary | ICD-10-CM

## 2016-06-25 DIAGNOSIS — Z791 Long term (current) use of non-steroidal anti-inflammatories (NSAID): Secondary | ICD-10-CM | POA: Insufficient documentation

## 2016-06-25 DIAGNOSIS — Z853 Personal history of malignant neoplasm of breast: Secondary | ICD-10-CM | POA: Insufficient documentation

## 2016-06-25 DIAGNOSIS — Z87891 Personal history of nicotine dependence: Secondary | ICD-10-CM | POA: Insufficient documentation

## 2016-06-25 HISTORY — DX: Malignant (primary) neoplasm, unspecified: C80.1

## 2016-06-25 LAB — COMPREHENSIVE METABOLIC PANEL
ALK PHOS: 89 U/L (ref 38–126)
ALT: 26 U/L (ref 14–54)
ANION GAP: 10 (ref 5–15)
AST: 28 U/L (ref 15–41)
Albumin: 3.9 g/dL (ref 3.5–5.0)
BILIRUBIN TOTAL: 0.5 mg/dL (ref 0.3–1.2)
BUN: 18 mg/dL (ref 6–20)
CALCIUM: 8.8 mg/dL — AB (ref 8.9–10.3)
CO2: 21 mmol/L — ABNORMAL LOW (ref 22–32)
Chloride: 106 mmol/L (ref 101–111)
Creatinine, Ser: 0.91 mg/dL (ref 0.44–1.00)
GFR calc non Af Amer: >60 mL/min (ref >60)
GLUCOSE: 95 mg/dL (ref 65–99)
Potassium: 3.4 mmol/L — ABNORMAL LOW (ref 3.5–5.1)
Sodium: 137 mmol/L (ref 135–145)
TOTAL PROTEIN: 7 g/dL (ref 6.5–8.1)

## 2016-06-25 LAB — URINALYSIS, ROUTINE W REFLEX MICROSCOPIC
BILIRUBIN URINE: NEGATIVE
Glucose, UA: NEGATIVE mg/dL
Hgb urine dipstick: NEGATIVE
Ketones, ur: 15 mg/dL — AB
Leukocytes, UA: NEGATIVE
NITRITE: NEGATIVE
PROTEIN: NEGATIVE mg/dL
SPECIFIC GRAVITY, URINE: 1.025 (ref 1.005–1.030)
pH: 5.5 (ref 5.0–8.0)

## 2016-06-25 LAB — CBC
HCT: 38.8 % (ref 36.0–46.0)
HEMOGLOBIN: 13 g/dL (ref 12.0–15.0)
MCH: 29 pg (ref 26.0–34.0)
MCHC: 33.5 g/dL (ref 30.0–36.0)
MCV: 86.4 fL (ref 78.0–100.0)
PLATELETS: 267 10*3/uL (ref 150–400)
RBC: 4.49 MIL/uL (ref 3.87–5.11)
RDW: 13.7 % (ref 11.5–15.5)
WBC: 6.7 10*3/uL (ref 4.0–10.5)

## 2016-06-25 LAB — LIPASE, BLOOD: Lipase: 28 U/L (ref 11–51)

## 2016-06-25 MED ORDER — ONDANSETRON 4 MG PO TBDP
4.0000 mg | ORAL_TABLET | Freq: Once | ORAL | Status: AC | PRN
  Administered 2016-06-25: 4 mg via ORAL
  Filled 2016-06-25: qty 1

## 2016-06-25 MED ORDER — SODIUM CHLORIDE 0.9 % IV BOLUS (SEPSIS)
1000.0000 mL | Freq: Once | INTRAVENOUS | Status: AC
  Administered 2016-06-25: 1000 mL via INTRAVENOUS

## 2016-06-25 MED ORDER — ACETAMINOPHEN 325 MG PO TABS
650.0000 mg | ORAL_TABLET | Freq: Once | ORAL | Status: AC
  Administered 2016-06-25: 650 mg via ORAL
  Filled 2016-06-25: qty 2

## 2016-06-25 NOTE — ED Notes (Signed)
Patient transported to X-ray 

## 2016-06-25 NOTE — Progress Notes (Signed)
Patient listed as having [place] insurance without a pcp.  Patient confirms her pcp is Dr. Thressa Sheller.  System updated.

## 2016-06-25 NOTE — ED Notes (Signed)
Pt sent by PCP r/o dehydration.  C/o fever and n/v/d x around 1 week.  Pt had a temp 99.4 in office.  Sts Pt recently returned from beach.

## 2016-06-25 NOTE — ED Provider Notes (Signed)
[facility] DEPT Provider Note   CSN: [id number] Arrival date & time: 06/25/16  1441  First Provider Contact:  None       History   Chief Complaint Chief Complaint  Patient presents with  . Fever  . Dehydration    HPI Alexis [name] is a 65 y.o. female with a past medical history of breast and thyroid cancer, von Willebrand's disease who presents to the ED today with complaints of fever and dehydration. Patient states that last week she went camping on the beach where it was 113. 4 days ago patient states that she began feeling dizzy as if she was overheated. She states that her skin felt very dry and she just felt dehydrated. She subsequently developed diffuse body aches and diarrhea. Patient also states that her urine feels very hot but denies dysuria. This morning when she woke up she still did not feel well and she checked her temperature and it read 102. She has been taking ibuprofen him aspirin and Tylenol for symptoms. She went to see her PCP today who sent her to the ED for further evaluation and IV fluids for dehydration. She denies any tick bites, sick contacts, rash, sore throat, otalgia, cough.  HPI  Past Medical History:  Diagnosis Date  . Cancer [facility])    breast and thyroid    There are no active problems to display for this patient.   Past Surgical History:  Procedure Laterality Date  . ABDOMINAL HYSTERECTOMY    . BREAST SURGERY    . cesearian    . THYROIDECTOMY      OB History    No data available       Home Medications    Prior to Admission medications   Medication Sig Start Date End Date Taking? Authorizing Provider  aspirin 325 MG EC tablet Take 650 mg by mouth once.   Yes Historical Provider, MD  ibuprofen (ADVIL,MOTRIN) 200 MG tablet Take 600 mg by mouth every 6 (six) hours as needed for headache, mild pain or moderate pain.   Yes Historical Provider, MD  levothyroxine (SYNTHROID, LEVOTHROID) 125 MCG tablet Take 125 mcg by mouth daily  before breakfast.   Yes Historical Provider, MD    Family History No family history on file.  Social History Social History  Substance Use Topics  . Smoking status: Former Smoker    Quit date: 1977  . Smokeless tobacco: Not on file     Comment: Quit 40 years ago  . Alcohol use 0.0 oz/week     Allergies   Review of patient's allergies indicates no known allergies.   Review of Systems Review of Systems  All other systems reviewed and are negative.    Physical Exam Updated Vital Signs BP 119/67 (BP Location: Left Arm)   Pulse 80   Temp 98.3 F (36.8 C) (Oral)   Resp 18   LMP  (LMP Unknown)   SpO2 99%   Physical Exam  Constitutional: She is oriented to person, place, and time. She appears well-developed and well-nourished. No distress.  HENT:  Head: Normocephalic and atraumatic.  Mouth/Throat: No oropharyngeal exudate.  Eyes: Conjunctivae and EOM are normal. Pupils are equal, round, and reactive to light. Right eye exhibits no discharge. Left eye exhibits no discharge. No scleral icterus.  Cardiovascular: Normal rate, regular rhythm, normal heart sounds and intact distal pulses.  Exam reveals no gallop and no friction rub.   No murmur heard. Pulmonary/Chest: Effort normal and breath sounds normal. No respiratory  distress. She has no wheezes. She has no rales. She exhibits no tenderness.  Abdominal: Soft. She exhibits no distension. There is no tenderness. There is no guarding.  Musculoskeletal: Normal range of motion. She exhibits no edema.  Neurological: She is alert and oriented to person, place, and time.  Strength 5/5 throughout. No sensory deficits.    Skin: Skin is warm and dry. No rash noted. She is not diaphoretic. No erythema. No pallor.  Psychiatric: She has a normal mood and affect. Her behavior is normal.  Nursing note and vitals reviewed.    ED Treatments / Results  Labs (all labs ordered are listed, but only abnormal results are displayed) Labs  Reviewed  COMPREHENSIVE METABOLIC PANEL - Abnormal; Notable for the following:       Result Value   Potassium 3.4 (*)    CO2 21 (*)    Calcium 8.8 (*)    All other components within normal limits  GASTROINTESTINAL PANEL BY PCR, STOOL (REPLACES STOOL CULTURE)  URINE CULTURE  LIPASE, BLOOD  CBC  URINALYSIS, ROUTINE W REFLEX MICROSCOPIC (NOT AT [facility])    EKG  EKG Interpretation None       Radiology No results found.  Procedures Procedures (including critical care time)  Medications Ordered in ED Medications  sodium chloride 0.9 % bolus 1,000 mL (not administered)  ondansetron (ZOFRAN-ODT) disintegrating tablet 4 mg (4 mg Oral Given 06/25/16 1643)     Initial Impression / Assessment and Plan / ED Course  I have reviewed the triage vital signs and the nursing notes.  Pertinent labs & imaging results that were available during my care of the patient were reviewed by me and considered in my medical decision making (see chart for details).  Clinical Course    66 year old female presents to the ED today complaining of fever and diarrhea after a camping trip last week. On exam patient does not appear   toxic or septic. Rectal temp is 100. No tachycardia or leukocytosis. Patient does not meet SIRS or sepsis criteria. Suspect patient's symptoms is likely viral given recent camping trip and diarrhea. However, GI pathogen stool studies ordered. Patient given IV fluids and ibuprofen with significant improvement in her symptoms. Temp coming down, now 98. Chest x-ray unremarkable. UA negative for infection. No other source of infection found. Patient unable to provide stool sample while in the ED. She was given kit to collected as an outpatient and bring  back to lab to be evaluated. Patient will follow up with her PCP next week for reevaluation. Strict return precautions given and discussed. Patient is hemodynamically stable and ready for discharge.  Case discussed with Dr. Laverta Baltimore who agrees  with treatment plan. Final Clinical Impressions(s) / ED Diagnoses   Final diagnoses:  Diarrhea, unspecified type  Other specified fever    New Prescriptions New Prescriptions   No medications on file     Carlos Levering, PA-C 06/27/16 [place], MD 06/27/16 1133

## 2016-06-25 NOTE — Discharge Instructions (Signed)
Continue taking home ibuprofen and tylenol for fever. Encourage adequate hydration, drink plenty of fluids. Follow up with your primary care provider for re-evaluation. Return to the ED if you experience severe neck pain/stiffness, loss of consciousness, abdominal pain, burning with urination, blood in stool, increased fever, vomiting.

## 2016-06-25 NOTE — ED Triage Notes (Signed)
Pt reports fever, fatigue, nausea, diarrhea, and body aches onset Thursday. Pt thinks she is dehydrated "from too much heat."

## 2016-06-25 NOTE — ED Notes (Signed)
IV attempted x2. Charge nurse to attempt.

## 2016-06-27 LAB — URINE CULTURE

## 2017-05-06 DIAGNOSIS — B9689 Other specified bacterial agents as the cause of diseases classified elsewhere: Secondary | ICD-10-CM | POA: Diagnosis not present

## 2017-05-06 DIAGNOSIS — J028 Acute pharyngitis due to other specified organisms: Secondary | ICD-10-CM | POA: Diagnosis not present

## 2017-08-27 DIAGNOSIS — L237 Allergic contact dermatitis due to plants, except food: Secondary | ICD-10-CM | POA: Diagnosis not present

## 2017-09-11 DIAGNOSIS — H2513 Age-related nuclear cataract, bilateral: Secondary | ICD-10-CM | POA: Diagnosis not present

## 2017-10-29 DIAGNOSIS — H2513 Age-related nuclear cataract, bilateral: Secondary | ICD-10-CM | POA: Diagnosis not present

## 2017-11-07 DIAGNOSIS — H25811 Combined forms of age-related cataract, right eye: Secondary | ICD-10-CM | POA: Diagnosis not present

## 2017-11-07 DIAGNOSIS — E890 Postprocedural hypothyroidism: Secondary | ICD-10-CM | POA: Diagnosis not present

## 2017-11-07 DIAGNOSIS — Z83518 Family history of other specified eye disorder: Secondary | ICD-10-CM | POA: Diagnosis not present

## 2017-11-07 DIAGNOSIS — D680 Von Willebrand's disease: Secondary | ICD-10-CM | POA: Diagnosis not present

## 2017-11-07 DIAGNOSIS — Z79899 Other long term (current) drug therapy: Secondary | ICD-10-CM | POA: Diagnosis not present

## 2017-11-07 DIAGNOSIS — Z85850 Personal history of malignant neoplasm of thyroid: Secondary | ICD-10-CM | POA: Diagnosis not present

## 2017-11-22 ENCOUNTER — Telehealth (HOSPITAL_COMMUNITY): Payer: Self-pay | Admitting: Family Medicine

## 2017-11-22 DIAGNOSIS — Z85850 Personal history of malignant neoplasm of thyroid: Secondary | ICD-10-CM | POA: Diagnosis not present

## 2017-11-22 DIAGNOSIS — Z0000 Encounter for general adult medical examination without abnormal findings: Secondary | ICD-10-CM | POA: Diagnosis not present

## 2017-11-22 DIAGNOSIS — R011 Cardiac murmur, unspecified: Secondary | ICD-10-CM | POA: Diagnosis not present

## 2017-11-22 DIAGNOSIS — Z1389 Encounter for screening for other disorder: Secondary | ICD-10-CM | POA: Diagnosis not present

## 2017-11-22 DIAGNOSIS — Z853 Personal history of malignant neoplasm of breast: Secondary | ICD-10-CM | POA: Diagnosis not present

## 2017-11-22 DIAGNOSIS — D680 Von Willebrand's disease: Secondary | ICD-10-CM | POA: Diagnosis not present

## 2017-11-22 DIAGNOSIS — E7800 Pure hypercholesterolemia, unspecified: Secondary | ICD-10-CM | POA: Diagnosis not present

## 2017-11-22 DIAGNOSIS — Z23 Encounter for immunization: Secondary | ICD-10-CM | POA: Diagnosis not present

## 2017-11-22 DIAGNOSIS — F439 Reaction to severe stress, unspecified: Secondary | ICD-10-CM | POA: Diagnosis not present

## 2017-11-22 DIAGNOSIS — Z1211 Encounter for screening for malignant neoplasm of colon: Secondary | ICD-10-CM | POA: Diagnosis not present

## 2017-11-22 DIAGNOSIS — E2839 Other primary ovarian failure: Secondary | ICD-10-CM | POA: Diagnosis not present

## 2017-11-25 ENCOUNTER — Other Ambulatory Visit: Payer: Self-pay | Admitting: Family Medicine

## 2017-11-25 DIAGNOSIS — R011 Cardiac murmur, unspecified: Secondary | ICD-10-CM

## 2017-11-28 DIAGNOSIS — Z961 Presence of intraocular lens: Secondary | ICD-10-CM | POA: Diagnosis not present

## 2017-11-28 DIAGNOSIS — Z9841 Cataract extraction status, right eye: Secondary | ICD-10-CM | POA: Diagnosis not present

## 2017-11-28 DIAGNOSIS — H25812 Combined forms of age-related cataract, left eye: Secondary | ICD-10-CM | POA: Diagnosis not present

## 2017-11-29 DIAGNOSIS — Z48810 Encounter for surgical aftercare following surgery on the sense organs: Secondary | ICD-10-CM | POA: Diagnosis not present

## 2017-11-29 DIAGNOSIS — Z961 Presence of intraocular lens: Secondary | ICD-10-CM | POA: Diagnosis not present

## 2017-11-29 NOTE — Telephone Encounter (Signed)
User: Cherie Dark A Date/time: 11/22/17 3:46 PM  Comment: Called pt and lmsg for her to CB to get sch for an echo  Context:  Outcome: Left Message  Phone number: [phone] Phone Type: Home Phone  Comm. type: Telephone Call type: Outgoing  Contact: Lucienne Minks S Relation to patient: Self

## 2017-12-02 DIAGNOSIS — M25562 Pain in left knee: Secondary | ICD-10-CM | POA: Diagnosis not present

## 2017-12-04 ENCOUNTER — Telehealth: Payer: Self-pay | Admitting: Hematology

## 2017-12-04 NOTE — Telephone Encounter (Signed)
Patient called to confirm new patient appointment for 1/29. Demographic/insurance information confirmed. Insurance information updated according to information provided by patient.   Medicare [place] #[id number] [place]

## 2017-12-04 NOTE — Telephone Encounter (Signed)
Left message on voicemail for patient with D/T/Loc/Ph#

## 2017-12-09 ENCOUNTER — Ambulatory Visit (HOSPITAL_COMMUNITY): Payer: Medicare Other | Attending: Cardiovascular Disease

## 2017-12-09 ENCOUNTER — Other Ambulatory Visit: Payer: Self-pay

## 2017-12-09 DIAGNOSIS — I080 Rheumatic disorders of both mitral and aortic valves: Secondary | ICD-10-CM | POA: Insufficient documentation

## 2017-12-09 DIAGNOSIS — I5030 Unspecified diastolic (congestive) heart failure: Secondary | ICD-10-CM | POA: Diagnosis not present

## 2017-12-09 DIAGNOSIS — R011 Cardiac murmur, unspecified: Secondary | ICD-10-CM | POA: Diagnosis not present

## 2017-12-13 DIAGNOSIS — S8002XA Contusion of left knee, initial encounter: Secondary | ICD-10-CM | POA: Diagnosis not present

## 2017-12-19 NOTE — Progress Notes (Signed)
HEMATOLOGY/ONCOLOGY CONSULTATION NOTE  Date of Service: 12/24/2017  Patient Care Team: Antony Contras, MD as PCP - General (Family Medicine)  CHIEF COMPLAINTS/PURPOSE OF CONSULTATION:  Von Willebrand's Disease  HISTORY OF PRESENTING ILLNESS:    Alexis [name] is a wonderful 67 y.o. female who has been referred to [place] by his PCP, Dr. Antony Contras from [facility], for evaluation and management of Von Willebrand's Disease.   She was diagnosed between 2008-2009 picked up between her breast and thyroid surgery given her continued bleeding after her breast surgery. She reports her mother was a free bleeder. 2 of her children were diagnosed with Von Willebrand's Disease.  She saw Dr. Marin Olp in 2017 for management of VWD. Today she reports she may have a possible left knee surgery soon. She notes she has some bruises at times spontaneously on her extremities. This is not frequent or associated with certain activities. She does not take aspirin due to her disease. She notes her BP was elevated today, she thinks related to her left knee pain. She takes tylenol for the pain.   She has a history of DCIS of left breast in 2008 and  had a left breast lumpectomy with adjuvant RT and endocrine therapy. She report having thyroid cancer in 2009. She had a thyroidectomy and RAI. No evidence of recurrence.   She monitors with yearly mammograms and checks ups/exams. She denies any other significant medical history.    She had a partial hysterectomy in the past due to fibroid tumors. She had heavy menorrhagia when she was young, same with her daughters. She used to smoke cigarettes as a teenager. She was treated with DDAVP by Dr. Audelia Hives in the past before and after surgery.   On review of symptoms, pt  Left knee pain and occasional nose bleeds(1-2 times a year) and bruising. Her nose bleeds occur every few months with mild bleeding for about 5 minutes. She does not know the trigger of her  nose bleeds. She only has bleeding from her gums when she flosses. She does not have blood in stool. She has had a stool test by PCP which was neg for FOBT. She notes that she has not had to carry stimate spray.   MEDICAL HISTORY:  Past Medical History:  Diagnosis Date  . Cancer [facility])    breast and thyroid    SURGICAL HISTORY: Past Surgical History:  Procedure Laterality Date  . ABDOMINAL HYSTERECTOMY    . BREAST SURGERY    . cesearian    . THYROIDECTOMY      SOCIAL HISTORY: Social History   Socioeconomic History  . Marital status: Married    Spouse name: Not on file  . Number of children: Not on file  . Years of education: Not on file  . Highest education level: Not on file  Social Needs  . Financial resource strain: Not on file  . Food insecurity - worry: Not on file  . Food insecurity - inability: Not on file  . Transportation needs - medical: Not on file  . Transportation needs - non-medical: Not on file  Occupational History  . Not on file  Tobacco Use  . Smoking status: Former Smoker    Last attempt to quit: 1977    Years since quitting: 42.1  . Smokeless tobacco: Never Used  . Tobacco comment: Quit 40 years ago  Substance and Sexual Activity  . Alcohol use: Yes    Alcohol/week: 1.8 oz  Types: 3 Glasses of wine per week  . Drug use: No  . Sexual activity: Yes    Birth control/protection: None  Other Topics Concern  . Not on file  Social History Narrative  . Not on file    FAMILY HISTORY: History reviewed. No pertinent family history.  ALLERGIES:  has No Known Allergies.  MEDICATIONS:  Current Outpatient Medications  Medication Sig Dispense Refill  . levothyroxine (SYNTHROID, LEVOTHROID) 125 MCG tablet Take 125 mcg by mouth daily before breakfast.     No current facility-administered medications for this visit.     REVIEW OF SYSTEMS:    10 Point review of Systems was done is negative except as noted above.  PHYSICAL EXAMINATION: ECOG  PERFORMANCE STATUS: 1 - Symptomatic but completely ambulatory  . Vitals:   12/24/17 1136  BP: (!) 174/87  Pulse: 71  Resp: 20  Temp: 98.2 F (36.8 C)  SpO2: 100%   Filed Weights   12/24/17 1136  Weight: 156 lb (70.8 kg)   .Body mass index is 30.47 kg/m.  GENERAL:alert, in no acute distress and comfortable SKIN: no acute rashes, no significant lesions EYES: conjunctiva are pink and non-injected, sclera anicteric OROPHARYNX: MMM, no exudates, no oropharyngeal erythema or ulceration NECK: supple, no JVD LYMPH:  no palpable lymphadenopathy in the cervical, axillary or inguinal regions LUNGS: clear to auscultation b/l with normal respiratory effort HEART: regular rate & rhythm ABDOMEN:  normoactive bowel sounds , non tender, not distended. Extremity: no pedal edema PSYCH: alert & oriented x 3 with fluent speech NEURO: no focal motor/sensory deficits  LABORATORY DATA:  I have reviewed the data as listed  . CBC Latest Ref Rng & Units 12/24/2017 06/25/2016 12/28/2015  WBC 3.9 - 10.3 K/uL 8.7 6.7 4.9  Hemoglobin 12.0 - 15.0 g/dL - 13.0 14.8  Hematocrit 34.8 - 46.6 % 43.0 38.8 44.1  Platelets 145 - 400 K/uL 279 267 256    . CMP Latest Ref Rng & Units 12/24/2017 06/25/2016 12/28/2015  Glucose 70 - 140 mg/dL 87 95 91  BUN 7 - 26 mg/dL 16 18 17.3  Creatinine 0.44 - 1.00 mg/dL - 0.91 0.8  Sodium 136 - 145 mmol/L 139 137 139  Potassium 3.3 - 4.7 mmol/L 3.6 3.4(L) 3.8  Chloride 98 - 109 mmol/L 105 106 -  CO2 22 - 29 mmol/L 23 21(L) 24  Calcium 8.4 - 10.4 mg/dL 9.2 8.8(L) 9.3  Total Protein 6.4 - 8.3 g/dL 7.6 7.0 7.7  Total Bilirubin 0.2 - 1.2 mg/dL 0.5 0.5 0.75  Alkaline Phos 40 - 150 U/L 83 89 74  AST 5 - 34 U/L 16 28 17   ALT 0 - 55 U/L 21 26 13    . Lab Results  Component Value Date   IRON 83 12/24/2017   TIBC 339 12/24/2017   IRONPCTSAT 25 12/24/2017   (Iron and TIBC)  Lab Results  Component Value Date   FERRITIN 177 12/24/2017    . Lab Results  Component Value  Date   RETICCTPCT 1.5 12/24/2017   RBC 4.91 12/24/2017   RBC 4.91 12/24/2017     Component     Latest Ref Rng & Units 12/28/2015 12/24/2017  Factor VIII Activity     57 - 163 % 24 (L)   von Willebrand Factor (vWF) Ag     50 - 200 % 12 (L)   vWF Activity     50 - 200 % 13 (L)   Interpretation      Note  PDF Image      .   Coagulation Factor VIII     57 - 163 %  32 (L)  Ristocetin Co-factor, Plasma     50 - 200 %  11 (L)  Von Willebrand Antigen, Plasma     50 - 200 %  14 (L)    Outside labs from 11/22/17:         RADIOGRAPHIC STUDIES: I have personally reviewed the radiological images as listed and agreed with the findings in the report. No results found.  ASSESSMENT & PLAN:  JARI CAROLLO is a 67 y.o. caucasian female with     1. Von Willebrand's Disease - Type 1 -mild with VWD Ag and Activity levels in the 10-15% range and FVIII levels of about 30%  -She currently experiences mild bleeding of nose, gums, and bruising occasionally not needing treatment. No official Stimate results available. She has tolerated and reportedly responded adequately to IV ddAVP in the past with thyroidectomy and hysterectomy. Did have significant post-operatively bleeding her breast lumpectomy when VWD was not treated pre-operatively.  PLAN:  -we discussed her personal bleeding history and fhx in details. -I discussed her previous VWD panel  Her levels are above 10% or higher with mild manifestations.  -rpt VWD panel from today reviewed and remains unchanged. -I encouraged her to watch to worsening bruising or nose or gum bleeding. I discussed the options of amicar or taking stimate spray as needed if bleeding significantly worsens.  -I suggest OTC vitamin C or orange juice to help with bruising.  -At baseline she does not need aggressive treatment -I discussed treatment with IV DDAVP or stimate spray for minor procedures needing correction of VW activity/factor levels to 50%    -might need intermdeiate purity FVIII replacement for significant bleeding or significant surgeries with more significant tissue trauma and where factor correction to 100% would be desire and hemostasis may need to be maintained for longer. -Will get base line labs today and will see her as needed.  -she will let us know if any surgeries are planned.   2.  Past Medical History:  Diagnosis Date  . Cancer (HCC)    breast and thyroid  -estrogen deficiency, elevated LDL cholesterol levels.    3. Left knee pain due to profound arthritis -She plans to get a MRI soon and is considering left knee surgery.  -Will follow up with her orthopedic surgeon and notify me if she plans to have surgery.    Labs today  RTC with Dr Irene Limbo as needed    All of the patients questions were answered with apparent satisfaction. The patient knows to call the clinic with any problems, questions or concerns.  I spent 45 minutes counseling the patient face to face. The total time spent in the appointment was 60 minutes and more than 50% was on counseling and direct patient cares.    Sullivan Lone MD [place] AAHIVMS [facility] [place] [facility] Hematology/Oncology Physician [facility]  (Office):       [phone] (Work cell):  [phone] (Fax):           [phone]  12/24/2017 1:09 PM   This document serves as a record of services personally performed by Sullivan Lone, MD. It was created on his behalf by Joslyn Devon, a trained medical scribe. The creation of this record is based on the scribe's personal observations and the provider's statements to them.    .I have reviewed the above documentation for accuracy and  completeness, and I agree with the above. Brunetta Genera MD MS

## 2017-12-20 DIAGNOSIS — S83242A Other tear of medial meniscus, current injury, left knee, initial encounter: Secondary | ICD-10-CM | POA: Diagnosis not present

## 2017-12-24 ENCOUNTER — Telehealth: Payer: Self-pay

## 2017-12-24 ENCOUNTER — Inpatient Hospital Stay: Payer: Medicare Other | Attending: Hematology | Admitting: Hematology

## 2017-12-24 ENCOUNTER — Inpatient Hospital Stay: Payer: Medicare Other

## 2017-12-24 ENCOUNTER — Encounter: Payer: Self-pay | Admitting: Hematology

## 2017-12-24 VITALS — BP 174/87 | HR 71 | Temp 98.2°F | Resp 20 | Ht 60.0 in | Wt 156.0 lb

## 2017-12-24 DIAGNOSIS — M1990 Unspecified osteoarthritis, unspecified site: Secondary | ICD-10-CM

## 2017-12-24 DIAGNOSIS — Z923 Personal history of irradiation: Secondary | ICD-10-CM | POA: Insufficient documentation

## 2017-12-24 DIAGNOSIS — Z86000 Personal history of in-situ neoplasm of breast: Secondary | ICD-10-CM | POA: Diagnosis not present

## 2017-12-24 DIAGNOSIS — Z87891 Personal history of nicotine dependence: Secondary | ICD-10-CM | POA: Insufficient documentation

## 2017-12-24 DIAGNOSIS — Z90710 Acquired absence of both cervix and uterus: Secondary | ICD-10-CM | POA: Diagnosis not present

## 2017-12-24 DIAGNOSIS — E7800 Pure hypercholesterolemia, unspecified: Secondary | ICD-10-CM | POA: Diagnosis not present

## 2017-12-24 DIAGNOSIS — R040 Epistaxis: Secondary | ICD-10-CM | POA: Diagnosis not present

## 2017-12-24 DIAGNOSIS — D6801 Von willebrand disease, type 1: Secondary | ICD-10-CM

## 2017-12-24 DIAGNOSIS — Z79899 Other long term (current) drug therapy: Secondary | ICD-10-CM | POA: Insufficient documentation

## 2017-12-24 DIAGNOSIS — D680 Von Willebrand's disease: Secondary | ICD-10-CM | POA: Diagnosis not present

## 2017-12-24 DIAGNOSIS — Z85850 Personal history of malignant neoplasm of thyroid: Secondary | ICD-10-CM | POA: Insufficient documentation

## 2017-12-24 DIAGNOSIS — Z9223 Personal history of estrogen therapy: Secondary | ICD-10-CM | POA: Diagnosis not present

## 2017-12-24 DIAGNOSIS — M25562 Pain in left knee: Secondary | ICD-10-CM | POA: Diagnosis not present

## 2017-12-24 LAB — CMP (CANCER CENTER ONLY)
ALBUMIN: 4.3 g/dL (ref 3.5–5.0)
ALK PHOS: 83 U/L (ref 40–150)
ALT: 21 U/L (ref 0–55)
AST: 16 U/L (ref 5–34)
Anion gap: 11 (ref 3–11)
BUN: 16 mg/dL (ref 7–26)
CALCIUM: 9.2 mg/dL (ref 8.4–10.4)
CHLORIDE: 105 mmol/L (ref 98–109)
CO2: 23 mmol/L (ref 22–29)
CREATININE: 0.69 mg/dL (ref 0.60–1.10)
GFR, Estimated: >60 mL/min (ref >60)
GLUCOSE: 87 mg/dL (ref 70–140)
Potassium: 3.6 mmol/L (ref 3.3–4.7)
SODIUM: 139 mmol/L (ref 136–145)
Total Bilirubin: 0.5 mg/dL (ref 0.2–1.2)
Total Protein: 7.6 g/dL (ref 6.4–8.3)

## 2017-12-24 LAB — CBC WITH DIFFERENTIAL (CANCER CENTER ONLY)
Basophils Absolute: 0.1 10*3/uL (ref 0.0–0.1)
Basophils Relative: 1 %
EOS ABS: 0.2 10*3/uL (ref 0.0–0.5)
Eosinophils Relative: 2 %
HEMATOCRIT: 43 % (ref 34.8–46.6)
HEMOGLOBIN: 14.5 g/dL (ref 11.6–15.9)
LYMPHS ABS: 2.6 10*3/uL (ref 0.9–3.3)
Lymphocytes Relative: 30 %
MCH: 29.5 pg (ref 25.1–34.0)
MCHC: 33.7 g/dL (ref 31.5–36.0)
MCV: 87.6 fL (ref 79.5–101.0)
MONOS PCT: 7 %
Monocytes Absolute: 0.6 10*3/uL (ref 0.1–0.9)
NEUTROS PCT: 60 %
Neutro Abs: 5.3 10*3/uL (ref 1.5–6.5)
Platelet Count: 279 10*3/uL (ref 145–400)
RBC: 4.91 MIL/uL (ref 3.70–5.45)
RDW: 14.2 % (ref 11.2–16.1)
WBC Count: 8.7 10*3/uL (ref 3.9–10.3)

## 2017-12-24 LAB — RETICULOCYTES
RBC.: 4.91 MIL/uL (ref 3.70–5.45)
Retic Count, Absolute: 73.7 10*3/uL (ref 33.7–90.7)
Retic Ct Pct: 1.5 % (ref 0.7–2.1)

## 2017-12-24 NOTE — Telephone Encounter (Signed)
Printed avs and calender for upcoming appointment. Per 1/29

## 2017-12-25 DIAGNOSIS — M25562 Pain in left knee: Secondary | ICD-10-CM | POA: Diagnosis not present

## 2017-12-25 LAB — FERRITIN: Ferritin: 177 ng/mL (ref 9–269)

## 2017-12-25 LAB — IRON AND TIBC
Iron: 83 ug/dL (ref 41–142)
Saturation Ratios: 25 % (ref 21–57)
TIBC: 339 ug/dL (ref 236–444)
UIBC: 256 ug/dL

## 2017-12-27 DIAGNOSIS — M25562 Pain in left knee: Secondary | ICD-10-CM | POA: Diagnosis not present

## 2017-12-27 LAB — VON WILLEBRAND PANEL
Coagulation Factor VIII: 32 % — ABNORMAL LOW (ref 57–163)
RISTOCETIN CO-FACTOR, PLASMA: 11 % — AB (ref 50–200)
Von Willebrand Antigen, Plasma: 14 % — ABNORMAL LOW (ref 50–200)

## 2017-12-27 LAB — COAG STUDIES INTERP REPORT

## 2018-01-01 LAB — VON WILLEBRAND FACTOR MULTIMER

## 2018-01-03 ENCOUNTER — Encounter: Payer: Self-pay | Admitting: *Deleted

## 2018-01-03 NOTE — Progress Notes (Signed)
Patient called stating [facility] is requesting surgical clearance from Dr. Irene Limbo due to [place].  Informed patient that [facility] would need to fax [place] a form for Dr. Irene Limbo to fill out for request.  Fax received this morning and given to Dr. Irene Limbo.  Dr. Irene Limbo to let RN know how to proceed.

## 2018-01-06 ENCOUNTER — Telehealth: Payer: Self-pay | Admitting: *Deleted

## 2018-01-06 NOTE — Telephone Encounter (Signed)
Received call from [facility] at Dr. Rexford Maus office requesting presurgical clearance. Patient is eager to schedule surgery. Sherry's return # is [phone] and fax # is [phone].

## 2018-01-07 ENCOUNTER — Encounter: Payer: Self-pay | Admitting: *Deleted

## 2018-01-07 NOTE — Progress Notes (Signed)
Faxed paperwork back to [facility] @ [phone] for surgical clearance.

## 2018-01-08 NOTE — Progress Notes (Signed)
Received call from [facility] that they need Dr. Grier Mitts last office note and lab results faxed to [phone]. Confirmation received that this fax went through.

## 2018-01-09 DIAGNOSIS — M94262 Chondromalacia, left knee: Secondary | ICD-10-CM | POA: Diagnosis not present

## 2018-01-09 DIAGNOSIS — M23222 Derangement of posterior horn of medial meniscus due to old tear or injury, left knee: Secondary | ICD-10-CM | POA: Diagnosis not present

## 2018-01-09 DIAGNOSIS — S83242A Other tear of medial meniscus, current injury, left knee, initial encounter: Secondary | ICD-10-CM | POA: Diagnosis not present

## 2018-01-22 DIAGNOSIS — S83242D Other tear of medial meniscus, current injury, left knee, subsequent encounter: Secondary | ICD-10-CM | POA: Diagnosis not present

## 2018-02-01 ENCOUNTER — Ambulatory Visit (HOSPITAL_COMMUNITY): Payer: Medicare Other

## 2018-02-01 ENCOUNTER — Ambulatory Visit (HOSPITAL_COMMUNITY)
Admission: RE | Admit: 2018-02-01 | Discharge: 2018-02-01 | Disposition: A | Discharge status: Home / Self Care | Payer: Medicare Other | Source: Ambulatory Visit | Attending: Physician Assistant | Admitting: Physician Assistant

## 2018-02-01 ENCOUNTER — Other Ambulatory Visit: Payer: Self-pay | Admitting: Physician Assistant

## 2018-02-01 DIAGNOSIS — M79662 Pain in left lower leg: Secondary | ICD-10-CM

## 2018-02-01 NOTE — Progress Notes (Signed)
3 weeks post op with new onset calf pain last Monday after twisting leg

## 2018-02-01 NOTE — Progress Notes (Signed)
VASCULAR LAB PRELIMINARY  PRELIMINARY  PRELIMINARY  PRELIMINARY  Left lower extremity venous duplex completed.    Preliminary report:  There is no DVT or SVT noted in the left lower extremity.  Fluid noted distal thigh and medial knee.  Mika Anastasi, RVT 02/01/2018, 11:34 AM

## 2018-02-19 DIAGNOSIS — S83242D Other tear of medial meniscus, current injury, left knee, subsequent encounter: Secondary | ICD-10-CM | POA: Diagnosis not present

## 2018-02-20 DIAGNOSIS — E2839 Other primary ovarian failure: Secondary | ICD-10-CM | POA: Diagnosis not present

## 2018-02-20 DIAGNOSIS — M8588 Other specified disorders of bone density and structure, other site: Secondary | ICD-10-CM | POA: Diagnosis not present

## 2018-03-04 DIAGNOSIS — R05 Cough: Secondary | ICD-10-CM | POA: Diagnosis not present

## 2018-04-04 DIAGNOSIS — H26491 Other secondary cataract, right eye: Secondary | ICD-10-CM | POA: Diagnosis not present

## 2018-04-23 DIAGNOSIS — M1712 Unilateral primary osteoarthritis, left knee: Secondary | ICD-10-CM | POA: Diagnosis not present

## 2018-05-05 DIAGNOSIS — C73 Malignant neoplasm of thyroid gland: Secondary | ICD-10-CM | POA: Diagnosis not present

## 2018-05-05 DIAGNOSIS — E890 Postprocedural hypothyroidism: Secondary | ICD-10-CM | POA: Diagnosis not present

## 2018-05-27 DIAGNOSIS — E890 Postprocedural hypothyroidism: Secondary | ICD-10-CM | POA: Diagnosis not present

## 2018-05-27 DIAGNOSIS — C73 Malignant neoplasm of thyroid gland: Secondary | ICD-10-CM | POA: Diagnosis not present

## 2018-06-02 DIAGNOSIS — M1712 Unilateral primary osteoarthritis, left knee: Secondary | ICD-10-CM | POA: Diagnosis not present

## 2018-06-13 ENCOUNTER — Emergency Department (HOSPITAL_COMMUNITY)
Admission: EM | Admit: 2018-06-13 | Discharge: 2018-06-13 | Disposition: A | Discharge status: Home / Self Care | Payer: Medicare Other | Attending: Emergency Medicine | Admitting: Emergency Medicine

## 2018-06-13 ENCOUNTER — Encounter (HOSPITAL_COMMUNITY): Payer: Self-pay

## 2018-06-13 ENCOUNTER — Emergency Department (HOSPITAL_COMMUNITY): Payer: Medicare Other

## 2018-06-13 ENCOUNTER — Other Ambulatory Visit: Payer: Self-pay

## 2018-06-13 DIAGNOSIS — Z87891 Personal history of nicotine dependence: Secondary | ICD-10-CM | POA: Diagnosis not present

## 2018-06-13 DIAGNOSIS — Z85850 Personal history of malignant neoplasm of thyroid: Secondary | ICD-10-CM | POA: Insufficient documentation

## 2018-06-13 DIAGNOSIS — R0689 Other abnormalities of breathing: Secondary | ICD-10-CM | POA: Diagnosis not present

## 2018-06-13 DIAGNOSIS — R079 Chest pain, unspecified: Secondary | ICD-10-CM | POA: Insufficient documentation

## 2018-06-13 DIAGNOSIS — R0602 Shortness of breath: Secondary | ICD-10-CM

## 2018-06-13 DIAGNOSIS — I10 Essential (primary) hypertension: Secondary | ICD-10-CM | POA: Diagnosis not present

## 2018-06-13 DIAGNOSIS — Z79899 Other long term (current) drug therapy: Secondary | ICD-10-CM | POA: Diagnosis not present

## 2018-06-13 DIAGNOSIS — Z853 Personal history of malignant neoplasm of breast: Secondary | ICD-10-CM | POA: Diagnosis not present

## 2018-06-13 DIAGNOSIS — R0789 Other chest pain: Secondary | ICD-10-CM | POA: Diagnosis not present

## 2018-06-13 DIAGNOSIS — R110 Nausea: Secondary | ICD-10-CM | POA: Diagnosis not present

## 2018-06-13 LAB — BASIC METABOLIC PANEL
Anion gap: 11 (ref 5–15)
BUN: 14 mg/dL (ref 8–23)
CHLORIDE: 109 mmol/L (ref 98–111)
CO2: 21 mmol/L — AB (ref 22–32)
Calcium: 9.2 mg/dL (ref 8.9–10.3)
Creatinine, Ser: 0.68 mg/dL (ref 0.44–1.00)
GFR calc non Af Amer: >60 mL/min (ref >60)
Glucose, Bld: 104 mg/dL — ABNORMAL HIGH (ref 70–99)
POTASSIUM: 3.3 mmol/L — AB (ref 3.5–5.1)
SODIUM: 141 mmol/L (ref 135–145)

## 2018-06-13 LAB — CBC
HEMATOCRIT: 43.5 % (ref 36.0–46.0)
HEMOGLOBIN: 14.1 g/dL (ref 12.0–15.0)
MCH: 28.5 pg (ref 26.0–34.0)
MCHC: 32.4 g/dL (ref 30.0–36.0)
MCV: 87.9 fL (ref 78.0–100.0)
Platelets: 301 10*3/uL (ref 150–400)
RBC: 4.95 MIL/uL (ref 3.87–5.11)
RDW: 13.1 % (ref 11.5–15.5)
WBC: 5.9 10*3/uL (ref 4.0–10.5)

## 2018-06-13 LAB — I-STAT TROPONIN, ED
TROPONIN I, POC: 0 ng/mL (ref 0.00–0.08)
Troponin i, poc: 0 ng/mL (ref 0.00–0.08)

## 2018-06-13 NOTE — ED Provider Notes (Signed)
[facility] EMERGENCY DEPARTMENT Provider Note   CSN: [id number] Arrival date & time: 06/13/18  1016     History   Chief Complaint Chief Complaint  Patient presents with  . Chest Pain    HPI Alexis [name] is a 67 y.o. female.  The history is provided by the patient. No language interpreter was used.     67 year old female who is a former smoker with prior breast cancer status post breast surgery brought here via EMS from home for evaluation of chest pain.  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  Today as she was walking down the hall, she developed acute onset of pain to her chest.  Pain is described as a sharp sensation, stabbing, midsternal, intense with some nausea and difficulty catching her breath.  Pain lasted for approximately a few minutes before she reached out for help.  Her coworker called EMS.  She report she receive nitro via EMS and now her pain is mostly resolved.  She denies any associated lightheadedness, dizziness, shortness of breath, back pain, abdominal pain or arm pain.  Patient is unsure if her chest pain is related to stress.  She does endorse increasing work and family stress.  She denies any significant cardiac history, no history of hypertension, diabetes, and she is not a smoker.  Patient denies any prior history of PE DVT, no recent surgery, prolonged bedrest, active cancer, leg swelling or calf pain.  Past Medical History:  Diagnosis Date  . Cancer [facility])    breast and thyroid    There are no active problems to display for this patient.   Past Surgical History:  Procedure Laterality Date  . ABDOMINAL HYSTERECTOMY    . BREAST SURGERY    . cesearian    . THYROIDECTOMY       OB History   None      Home Medications    Prior to Admission medications   Medication Sig Start Date End Date Taking? Authorizing Provider  levothyroxine (SYNTHROID, LEVOTHROID) 125 MCG tablet Take 125 mcg by mouth daily before breakfast.     [provider]    Family History No family history on file.  Social History Social History   Tobacco Use  . Smoking status: Former Smoker    Last attempt to quit: 1977    Years since quitting: 42.5  . Smokeless tobacco: Never Used  . Tobacco comment: Quit 40 years ago  Substance Use Topics  . Alcohol use: Yes    Alcohol/week: 1.8 oz    Types: 3 Glasses of wine per week  . Drug use: No     Allergies   Patient has no known allergies.   Review of Systems Review of Systems  All other systems reviewed and are negative.    Physical Exam Updated Vital Signs BP (!) 143/112   Pulse 74   Temp 98.2 F (36.8 C) (Oral)   Resp 17   Ht 5' (1.524 m)   Wt 68 kg (150 lb)   LMP  (LMP Unknown)   SpO2 99%   BMI 29.29 kg/m   Physical Exam  Constitutional: She appears well-developed and well-nourished. No distress.  HENT:  Head: Atraumatic.  Eyes: Conjunctivae are normal.  Neck: Neck supple.  Cardiovascular: Normal rate, regular rhythm, intact distal pulses and normal pulses.  Pulmonary/Chest: Effort normal and breath sounds normal.  Abdominal: Soft. There is no tenderness.  Musculoskeletal:       Right lower leg: She exhibits  no edema.       Left lower leg: She exhibits no edema.  Neurological: She is alert.  Skin: No rash noted.  Psychiatric: She has a normal mood and affect.  Nursing note and vitals reviewed.    ED Treatments / Results  Labs (all labs ordered are listed, but only abnormal results are displayed) Labs Reviewed - No data to display  EKG None  Radiology No results found.  Procedures Procedures (including critical care time)  Medications Ordered in ED Medications - No data to display   Initial Impression / Assessment and Plan / ED Course  I have reviewed the triage vital signs and the nursing notes.  Pertinent labs & imaging results that were available during my care of the patient were reviewed by me and considered in my  medical decision making (see chart for details).     BP 126/82 (BP Location: Right Arm)   Pulse 78   Temp 98.2 F (36.8 C) (Oral)   Resp 15   Ht 5' (1.524 m)   Wt 68 kg (150 lb)   LMP  (LMP Unknown)   SpO2 98%   BMI 29.29 kg/m    Final Clinical Impressions(s) / ED Diagnoses   Final diagnoses:  Nonspecific chest pain    ED Discharge Orders    None     11:01 AM Patient with acute on nonexertional midsternal chest pain lasting for few minutes and has since mostly resolved.  She did receive sublingual nitro by EMS with improvement. HEART score of 3, low risk of MACE. Sxs not suggestive of PE.  Work up initiated, will check delta trop.  Care discussed with Dr. Ashok Cordia.  2:38 PM sxs resolved.  Normal delta trop.  Pt stable for discharge.  Recommend f/u with PCP next week for further evaluation of her condition.  She will benefit from outpt cardiac stress test.  Return precaution given.    Domenic Moras, PA-C 06/13/18 1438    Lajean Saver, MD 06/13/18 1452

## 2018-06-13 NOTE — ED Triage Notes (Addendum)
Pt from work via ems; Pt walking through the office around 0930 when she had gradual onset of CP; described as aching, pressure; pt nauseated, dry heaving, hyperventilating when EMS arrived; hypertensive initially (200/120), no hx htn; warm and dry, no sob currently; Hx cardiac murmer, thyroid cancer (thyroid removed), breast cancer  1 nitro pta w/ relief; 324 ASA  132/74 HR 72 RR 20

## 2018-06-13 NOTE — Discharge Instructions (Signed)
Please follow up closely with your primary care provider next week for further evaluation of your chest pain.  You may benefit from an outpatient cardiac stress test.  Return if your condition worsen or if you have other concerns.

## 2018-06-16 DIAGNOSIS — H26492 Other secondary cataract, left eye: Secondary | ICD-10-CM | POA: Diagnosis not present

## 2018-06-19 DIAGNOSIS — F411 Generalized anxiety disorder: Secondary | ICD-10-CM | POA: Diagnosis not present

## 2018-06-19 DIAGNOSIS — E7800 Pure hypercholesterolemia, unspecified: Secondary | ICD-10-CM | POA: Diagnosis not present

## 2018-06-19 DIAGNOSIS — R011 Cardiac murmur, unspecified: Secondary | ICD-10-CM | POA: Diagnosis not present

## 2018-08-27 DIAGNOSIS — E890 Postprocedural hypothyroidism: Secondary | ICD-10-CM | POA: Diagnosis not present

## 2018-10-21 DIAGNOSIS — J069 Acute upper respiratory infection, unspecified: Secondary | ICD-10-CM | POA: Diagnosis not present

## 2018-11-03 DIAGNOSIS — M7551 Bursitis of right shoulder: Secondary | ICD-10-CM | POA: Diagnosis not present

## 2018-12-24 DIAGNOSIS — D759 Disease of blood and blood-forming organs, unspecified: Secondary | ICD-10-CM | POA: Diagnosis not present

## 2018-12-24 DIAGNOSIS — Z832 Family history of diseases of the blood and blood-forming organs and certain disorders involving the immune mechanism: Secondary | ICD-10-CM | POA: Diagnosis not present

## 2018-12-24 DIAGNOSIS — Z90710 Acquired absence of both cervix and uterus: Secondary | ICD-10-CM | POA: Diagnosis not present

## 2018-12-24 DIAGNOSIS — D680 Von Willebrand's disease: Secondary | ICD-10-CM | POA: Diagnosis not present

## 2019-06-20 DIAGNOSIS — L255 Unspecified contact dermatitis due to plants, except food: Secondary | ICD-10-CM | POA: Diagnosis not present

## 2019-07-23 DIAGNOSIS — E7800 Pure hypercholesterolemia, unspecified: Secondary | ICD-10-CM | POA: Diagnosis not present

## 2019-07-23 DIAGNOSIS — Z23 Encounter for immunization: Secondary | ICD-10-CM | POA: Diagnosis not present

## 2019-07-23 DIAGNOSIS — R011 Cardiac murmur, unspecified: Secondary | ICD-10-CM | POA: Diagnosis not present

## 2019-07-23 DIAGNOSIS — R030 Elevated blood-pressure reading, without diagnosis of hypertension: Secondary | ICD-10-CM | POA: Diagnosis not present

## 2019-07-23 DIAGNOSIS — I359 Nonrheumatic aortic valve disorder, unspecified: Secondary | ICD-10-CM | POA: Diagnosis not present

## 2019-07-23 DIAGNOSIS — Z0000 Encounter for general adult medical examination without abnormal findings: Secondary | ICD-10-CM | POA: Diagnosis not present

## 2019-07-23 DIAGNOSIS — Z853 Personal history of malignant neoplasm of breast: Secondary | ICD-10-CM | POA: Diagnosis not present

## 2019-07-23 DIAGNOSIS — M85852 Other specified disorders of bone density and structure, left thigh: Secondary | ICD-10-CM | POA: Diagnosis not present

## 2019-07-23 DIAGNOSIS — Z1211 Encounter for screening for malignant neoplasm of colon: Secondary | ICD-10-CM | POA: Diagnosis not present

## 2019-07-23 DIAGNOSIS — F411 Generalized anxiety disorder: Secondary | ICD-10-CM | POA: Diagnosis not present

## 2019-07-23 DIAGNOSIS — Z85850 Personal history of malignant neoplasm of thyroid: Secondary | ICD-10-CM | POA: Diagnosis not present

## 2019-07-23 DIAGNOSIS — Z1389 Encounter for screening for other disorder: Secondary | ICD-10-CM | POA: Diagnosis not present

## 2019-07-29 DIAGNOSIS — E785 Hyperlipidemia, unspecified: Secondary | ICD-10-CM | POA: Diagnosis not present

## 2019-07-29 DIAGNOSIS — C73 Malignant neoplasm of thyroid gland: Secondary | ICD-10-CM | POA: Diagnosis not present

## 2019-07-29 DIAGNOSIS — E559 Vitamin D deficiency, unspecified: Secondary | ICD-10-CM | POA: Diagnosis not present

## 2019-07-29 DIAGNOSIS — E890 Postprocedural hypothyroidism: Secondary | ICD-10-CM | POA: Diagnosis not present

## 2019-08-06 DIAGNOSIS — C73 Malignant neoplasm of thyroid gland: Secondary | ICD-10-CM | POA: Diagnosis not present

## 2019-08-06 DIAGNOSIS — E559 Vitamin D deficiency, unspecified: Secondary | ICD-10-CM | POA: Diagnosis not present

## 2019-08-06 DIAGNOSIS — E890 Postprocedural hypothyroidism: Secondary | ICD-10-CM | POA: Diagnosis not present

## 2019-08-06 DIAGNOSIS — R221 Localized swelling, mass and lump, neck: Secondary | ICD-10-CM | POA: Diagnosis not present

## 2019-08-10 ENCOUNTER — Other Ambulatory Visit: Payer: Self-pay | Admitting: Endocrinology

## 2019-08-10 DIAGNOSIS — R221 Localized swelling, mass and lump, neck: Secondary | ICD-10-CM

## 2019-08-18 ENCOUNTER — Ambulatory Visit
Admission: RE | Admit: 2019-08-18 | Discharge: 2019-08-18 | Disposition: A | Discharge status: Home / Self Care | Payer: PPO | Source: Ambulatory Visit | Attending: Endocrinology | Admitting: Endocrinology

## 2019-08-18 DIAGNOSIS — R221 Localized swelling, mass and lump, neck: Secondary | ICD-10-CM

## 2019-08-18 DIAGNOSIS — I6522 Occlusion and stenosis of left carotid artery: Secondary | ICD-10-CM | POA: Diagnosis not present

## 2019-08-18 MED ORDER — IOPAMIDOL (ISOVUE-300) INJECTION 61%
75.0000 mL | Freq: Once | INTRAVENOUS | Status: AC | PRN
  Administered 2019-08-18: 75 mL via INTRAVENOUS

## 2019-09-03 DIAGNOSIS — E7800 Pure hypercholesterolemia, unspecified: Secondary | ICD-10-CM | POA: Diagnosis not present

## 2020-01-27 DIAGNOSIS — D680 Von Willebrand's disease: Secondary | ICD-10-CM | POA: Diagnosis not present

## 2020-07-06 DIAGNOSIS — E785 Hyperlipidemia, unspecified: Secondary | ICD-10-CM | POA: Diagnosis not present

## 2020-07-06 DIAGNOSIS — E890 Postprocedural hypothyroidism: Secondary | ICD-10-CM | POA: Diagnosis not present

## 2020-07-06 DIAGNOSIS — E559 Vitamin D deficiency, unspecified: Secondary | ICD-10-CM | POA: Diagnosis not present

## 2020-07-06 DIAGNOSIS — M8580 Other specified disorders of bone density and structure, unspecified site: Secondary | ICD-10-CM | POA: Diagnosis not present

## 2020-07-13 DIAGNOSIS — E785 Hyperlipidemia, unspecified: Secondary | ICD-10-CM | POA: Diagnosis not present

## 2020-07-13 DIAGNOSIS — M8580 Other specified disorders of bone density and structure, unspecified site: Secondary | ICD-10-CM | POA: Diagnosis not present

## 2020-07-13 DIAGNOSIS — E559 Vitamin D deficiency, unspecified: Secondary | ICD-10-CM | POA: Diagnosis not present

## 2020-07-13 DIAGNOSIS — C73 Malignant neoplasm of thyroid gland: Secondary | ICD-10-CM | POA: Diagnosis not present

## 2020-07-13 DIAGNOSIS — E890 Postprocedural hypothyroidism: Secondary | ICD-10-CM | POA: Diagnosis not present

## 2020-07-13 DIAGNOSIS — R7301 Impaired fasting glucose: Secondary | ICD-10-CM | POA: Diagnosis not present

## 2020-07-25 DIAGNOSIS — Z85850 Personal history of malignant neoplasm of thyroid: Secondary | ICD-10-CM | POA: Diagnosis not present

## 2020-07-25 DIAGNOSIS — E559 Vitamin D deficiency, unspecified: Secondary | ICD-10-CM | POA: Diagnosis not present

## 2020-07-25 DIAGNOSIS — R011 Cardiac murmur, unspecified: Secondary | ICD-10-CM | POA: Diagnosis not present

## 2020-07-25 DIAGNOSIS — I359 Nonrheumatic aortic valve disorder, unspecified: Secondary | ICD-10-CM | POA: Diagnosis not present

## 2020-07-25 DIAGNOSIS — E7800 Pure hypercholesterolemia, unspecified: Secondary | ICD-10-CM | POA: Diagnosis not present

## 2020-07-25 DIAGNOSIS — Z1389 Encounter for screening for other disorder: Secondary | ICD-10-CM | POA: Diagnosis not present

## 2020-07-25 DIAGNOSIS — R7309 Other abnormal glucose: Secondary | ICD-10-CM | POA: Diagnosis not present

## 2020-07-25 DIAGNOSIS — Z853 Personal history of malignant neoplasm of breast: Secondary | ICD-10-CM | POA: Diagnosis not present

## 2020-07-25 DIAGNOSIS — F411 Generalized anxiety disorder: Secondary | ICD-10-CM | POA: Diagnosis not present

## 2020-07-25 DIAGNOSIS — M85852 Other specified disorders of bone density and structure, left thigh: Secondary | ICD-10-CM | POA: Diagnosis not present

## 2020-07-25 DIAGNOSIS — Z0000 Encounter for general adult medical examination without abnormal findings: Secondary | ICD-10-CM | POA: Diagnosis not present

## 2020-07-25 DIAGNOSIS — Z1211 Encounter for screening for malignant neoplasm of colon: Secondary | ICD-10-CM | POA: Diagnosis not present

## 2020-07-26 ENCOUNTER — Other Ambulatory Visit: Payer: Self-pay | Admitting: Family Medicine

## 2020-07-26 DIAGNOSIS — M85852 Other specified disorders of bone density and structure, left thigh: Secondary | ICD-10-CM

## 2020-08-08 ENCOUNTER — Other Ambulatory Visit: Payer: Self-pay | Admitting: Family Medicine

## 2020-08-08 DIAGNOSIS — Z1231 Encounter for screening mammogram for malignant neoplasm of breast: Secondary | ICD-10-CM

## 2020-08-11 ENCOUNTER — Ambulatory Visit
Admission: RE | Admit: 2020-08-11 | Discharge: 2020-08-11 | Disposition: A | Discharge status: Home / Self Care | Payer: PPO | Source: Ambulatory Visit | Attending: Family Medicine | Admitting: Family Medicine

## 2020-08-11 ENCOUNTER — Other Ambulatory Visit: Payer: Self-pay

## 2020-08-11 DIAGNOSIS — Z1231 Encounter for screening mammogram for malignant neoplasm of breast: Secondary | ICD-10-CM

## 2020-08-11 HISTORY — DX: Personal history of irradiation: Z92.3

## 2020-09-08 DIAGNOSIS — S46211A Strain of muscle, fascia and tendon of other parts of biceps, right arm, initial encounter: Secondary | ICD-10-CM | POA: Diagnosis not present

## 2020-09-14 DIAGNOSIS — E890 Postprocedural hypothyroidism: Secondary | ICD-10-CM | POA: Diagnosis not present

## 2020-09-14 DIAGNOSIS — R7301 Impaired fasting glucose: Secondary | ICD-10-CM | POA: Diagnosis not present

## 2020-09-14 DIAGNOSIS — E559 Vitamin D deficiency, unspecified: Secondary | ICD-10-CM | POA: Diagnosis not present

## 2020-09-19 DIAGNOSIS — Z961 Presence of intraocular lens: Secondary | ICD-10-CM | POA: Diagnosis not present

## 2020-09-19 DIAGNOSIS — Z9841 Cataract extraction status, right eye: Secondary | ICD-10-CM | POA: Diagnosis not present

## 2020-09-19 DIAGNOSIS — H04123 Dry eye syndrome of bilateral lacrimal glands: Secondary | ICD-10-CM | POA: Diagnosis not present

## 2020-09-19 DIAGNOSIS — Z9842 Cataract extraction status, left eye: Secondary | ICD-10-CM | POA: Diagnosis not present

## 2020-09-19 DIAGNOSIS — H18513 Endothelial corneal dystrophy, bilateral: Secondary | ICD-10-CM | POA: Diagnosis not present

## 2020-10-24 ENCOUNTER — Other Ambulatory Visit: Payer: Self-pay

## 2020-10-24 ENCOUNTER — Ambulatory Visit
Admission: RE | Admit: 2020-10-24 | Discharge: 2020-10-24 | Disposition: A | Discharge status: Home / Self Care | Payer: PPO | Source: Ambulatory Visit | Attending: Family Medicine | Admitting: Family Medicine

## 2020-10-24 DIAGNOSIS — M85852 Other specified disorders of bone density and structure, left thigh: Secondary | ICD-10-CM

## 2021-04-25 DIAGNOSIS — E890 Postprocedural hypothyroidism: Secondary | ICD-10-CM | POA: Diagnosis not present

## 2021-05-05 ENCOUNTER — Emergency Department (HOSPITAL_COMMUNITY): Payer: PPO

## 2021-05-05 ENCOUNTER — Encounter (HOSPITAL_COMMUNITY): Payer: Self-pay | Admitting: Emergency Medicine

## 2021-05-05 ENCOUNTER — Other Ambulatory Visit: Payer: Self-pay

## 2021-05-05 ENCOUNTER — Inpatient Hospital Stay (HOSPITAL_COMMUNITY)
Admission: EM | Admit: 2021-05-05 | Discharge: 2021-05-09 | DRG: 811 | Disposition: A | Discharge status: Home / Self Care | Payer: PPO | Attending: Internal Medicine | Admitting: Internal Medicine

## 2021-05-05 DIAGNOSIS — E890 Postprocedural hypothyroidism: Secondary | ICD-10-CM | POA: Diagnosis present

## 2021-05-05 DIAGNOSIS — R040 Epistaxis: Secondary | ICD-10-CM | POA: Diagnosis present

## 2021-05-05 DIAGNOSIS — R195 Other fecal abnormalities: Secondary | ICD-10-CM | POA: Diagnosis not present

## 2021-05-05 DIAGNOSIS — R1310 Dysphagia, unspecified: Secondary | ICD-10-CM | POA: Diagnosis present

## 2021-05-05 DIAGNOSIS — Z85850 Personal history of malignant neoplasm of thyroid: Secondary | ICD-10-CM

## 2021-05-05 DIAGNOSIS — K254 Chronic or unspecified gastric ulcer with hemorrhage: Secondary | ICD-10-CM | POA: Diagnosis not present

## 2021-05-05 DIAGNOSIS — Z79890 Hormone replacement therapy (postmenopausal): Secondary | ICD-10-CM | POA: Diagnosis not present

## 2021-05-05 DIAGNOSIS — R011 Cardiac murmur, unspecified: Secondary | ICD-10-CM | POA: Diagnosis not present

## 2021-05-05 DIAGNOSIS — K3189 Other diseases of stomach and duodenum: Secondary | ICD-10-CM | POA: Diagnosis not present

## 2021-05-05 DIAGNOSIS — D649 Anemia, unspecified: Secondary | ICD-10-CM

## 2021-05-05 DIAGNOSIS — Z90710 Acquired absence of both cervix and uterus: Secondary | ICD-10-CM

## 2021-05-05 DIAGNOSIS — Z853 Personal history of malignant neoplasm of breast: Secondary | ICD-10-CM | POA: Diagnosis not present

## 2021-05-05 DIAGNOSIS — R531 Weakness: Secondary | ICD-10-CM | POA: Diagnosis not present

## 2021-05-05 DIAGNOSIS — Z87891 Personal history of nicotine dependence: Secondary | ICD-10-CM | POA: Diagnosis not present

## 2021-05-05 DIAGNOSIS — I10 Essential (primary) hypertension: Secondary | ICD-10-CM | POA: Diagnosis present

## 2021-05-05 DIAGNOSIS — Z923 Personal history of irradiation: Secondary | ICD-10-CM

## 2021-05-05 DIAGNOSIS — Z20822 Contact with and (suspected) exposure to covid-19: Secondary | ICD-10-CM | POA: Diagnosis present

## 2021-05-05 DIAGNOSIS — K5730 Diverticulosis of large intestine without perforation or abscess without bleeding: Secondary | ICD-10-CM | POA: Diagnosis present

## 2021-05-05 DIAGNOSIS — D500 Iron deficiency anemia secondary to blood loss (chronic): Secondary | ICD-10-CM | POA: Diagnosis not present

## 2021-05-05 DIAGNOSIS — D680 Von Willebrand's disease: Secondary | ICD-10-CM | POA: Diagnosis present

## 2021-05-05 DIAGNOSIS — D62 Acute posthemorrhagic anemia: Principal | ICD-10-CM | POA: Diagnosis present

## 2021-05-05 DIAGNOSIS — R002 Palpitations: Secondary | ICD-10-CM | POA: Diagnosis not present

## 2021-05-05 DIAGNOSIS — K922 Gastrointestinal hemorrhage, unspecified: Secondary | ICD-10-CM | POA: Diagnosis not present

## 2021-05-05 DIAGNOSIS — M791 Myalgia, unspecified site: Secondary | ICD-10-CM | POA: Diagnosis not present

## 2021-05-05 DIAGNOSIS — K319 Disease of stomach and duodenum, unspecified: Secondary | ICD-10-CM | POA: Diagnosis not present

## 2021-05-05 DIAGNOSIS — D509 Iron deficiency anemia, unspecified: Secondary | ICD-10-CM | POA: Diagnosis not present

## 2021-05-05 DIAGNOSIS — K5731 Diverticulosis of large intestine without perforation or abscess with bleeding: Secondary | ICD-10-CM | POA: Diagnosis not present

## 2021-05-05 DIAGNOSIS — K259 Gastric ulcer, unspecified as acute or chronic, without hemorrhage or perforation: Secondary | ICD-10-CM | POA: Diagnosis not present

## 2021-05-05 DIAGNOSIS — R0602 Shortness of breath: Secondary | ICD-10-CM | POA: Diagnosis not present

## 2021-05-05 LAB — CBC WITH DIFFERENTIAL/PLATELET
Abs Immature Granulocytes: 0.02 10*3/uL (ref 0.00–0.07)
Basophils Absolute: 0.1 10*3/uL (ref 0.0–0.1)
Basophils Relative: 1 %
Eosinophils Absolute: 0.1 10*3/uL (ref 0.0–0.5)
Eosinophils Relative: 2 %
HCT: 23.7 % — ABNORMAL LOW (ref 36.0–46.0)
Hemoglobin: 6.7 g/dL — CL (ref 12.0–15.0)
Immature Granulocytes: 0 %
Lymphocytes Relative: 29 %
Lymphs Abs: 1.6 10*3/uL (ref 0.7–4.0)
MCH: 22.5 pg — ABNORMAL LOW (ref 26.0–34.0)
MCHC: 28.3 g/dL — ABNORMAL LOW (ref 30.0–36.0)
MCV: 79.5 fL — ABNORMAL LOW (ref 80.0–100.0)
Monocytes Absolute: 0.4 10*3/uL (ref 0.1–1.0)
Monocytes Relative: 8 %
Neutro Abs: 3.4 10*3/uL (ref 1.7–7.7)
Neutrophils Relative %: 60 %
Platelets: 389 10*3/uL (ref 150–400)
RBC: 2.98 MIL/uL — ABNORMAL LOW (ref 3.87–5.11)
RDW: 17.1 % — ABNORMAL HIGH (ref 11.5–15.5)
WBC: 5.6 10*3/uL (ref 4.0–10.5)
nRBC: 0 % (ref 0.0–0.2)

## 2021-05-05 LAB — COMPREHENSIVE METABOLIC PANEL
ALT: 20 U/L (ref 0–44)
AST: 20 U/L (ref 15–41)
Albumin: 4.4 g/dL (ref 3.5–5.0)
Alkaline Phosphatase: 78 U/L (ref 38–126)
Anion gap: 8 (ref 5–15)
BUN: 14 mg/dL (ref 8–23)
CO2: 22 mmol/L (ref 22–32)
Calcium: 8.6 mg/dL — ABNORMAL LOW (ref 8.9–10.3)
Chloride: 109 mmol/L (ref 98–111)
Creatinine, Ser: 0.71 mg/dL (ref 0.44–1.00)
GFR, Estimated: >60 mL/min (ref >60)
Glucose, Bld: 99 mg/dL (ref 70–99)
Potassium: 3.5 mmol/L (ref 3.5–5.1)
Sodium: 139 mmol/L (ref 135–145)
Total Bilirubin: 0.5 mg/dL (ref 0.3–1.2)
Total Protein: 7.5 g/dL (ref 6.5–8.1)

## 2021-05-05 LAB — RESP PANEL BY RT-PCR (FLU A&B, COVID) ARPGX2
Influenza A by PCR: NEGATIVE
Influenza B by PCR: NEGATIVE
SARS Coronavirus 2 by RT PCR: NEGATIVE

## 2021-05-05 LAB — HEMOGLOBIN AND HEMATOCRIT, BLOOD
HCT: 21.6 % — ABNORMAL LOW (ref 36.0–46.0)
Hemoglobin: 6.3 g/dL — CL (ref 12.0–15.0)

## 2021-05-05 LAB — PREPARE RBC (CROSSMATCH)

## 2021-05-05 LAB — PROTIME-INR
INR: 1 (ref 0.8–1.2)
Prothrombin Time: 12.8 seconds (ref 11.4–15.2)

## 2021-05-05 LAB — HIV ANTIBODY (ROUTINE TESTING W REFLEX): HIV Screen 4th Generation wRfx: NONREACTIVE

## 2021-05-05 LAB — POC OCCULT BLOOD, ED: Fecal Occult Bld: POSITIVE — AB

## 2021-05-05 LAB — ABO/RH: ABO/RH(D): O POS

## 2021-05-05 MED ORDER — DOCUSATE SODIUM 100 MG PO CAPS
100.0000 mg | ORAL_CAPSULE | Freq: Two times a day (BID) | ORAL | Status: DC
  Administered 2021-05-05 – 2021-05-09 (×6): 100 mg via ORAL
  Filled 2021-05-05 (×6): qty 1

## 2021-05-05 MED ORDER — ACETAMINOPHEN 650 MG RE SUPP: 650.0000 mg | Freq: Four times a day (QID) | RECTAL | Status: DC | PRN

## 2021-05-05 MED ORDER — ONDANSETRON HCL 4 MG PO TABS: 4.0000 mg | ORAL_TABLET | Freq: Four times a day (QID) | ORAL | Status: DC | PRN

## 2021-05-05 MED ORDER — ACETAMINOPHEN 325 MG PO TABS
650.0000 mg | ORAL_TABLET | Freq: Four times a day (QID) | ORAL | Status: DC | PRN
  Administered 2021-05-07: 650 mg via ORAL
  Filled 2021-05-05: qty 2

## 2021-05-05 MED ORDER — LEVOTHYROXINE SODIUM 25 MCG PO TABS
125.0000 ug | ORAL_TABLET | Freq: Every day | ORAL | Status: DC
  Administered 2021-05-06 – 2021-05-09 (×4): 125 ug via ORAL
  Filled 2021-05-05 (×4): qty 1

## 2021-05-05 MED ORDER — PANTOPRAZOLE SODIUM 40 MG IV SOLR
40.0000 mg | Freq: Once | INTRAVENOUS | Status: AC
  Administered 2021-05-05: 40 mg via INTRAVENOUS
  Filled 2021-05-05: qty 40

## 2021-05-05 MED ORDER — PANTOPRAZOLE SODIUM 40 MG IV SOLR
40.0000 mg | Freq: Two times a day (BID) | INTRAVENOUS | Status: DC
  Administered 2021-05-05 – 2021-05-08 (×6): 40 mg via INTRAVENOUS
  Filled 2021-05-05 (×6): qty 40

## 2021-05-05 MED ORDER — SODIUM CHLORIDE 0.9 % IV SOLN: INTRAVENOUS | Status: DC

## 2021-05-05 MED ORDER — ONDANSETRON HCL 4 MG/2ML IJ SOLN: 4.0000 mg | Freq: Four times a day (QID) | INTRAMUSCULAR | Status: DC | PRN

## 2021-05-05 MED ORDER — SODIUM CHLORIDE 0.9 % IV SOLN: 10.0000 mL/h | Freq: Once | INTRAVENOUS | Status: DC

## 2021-05-05 NOTE — H&P (Signed)
History and Physical    DAHLIA NIFONG YIR:[id number] DOB: May 06, 1951 DOA: 05/05/2021  PCP: Merrilee Seashore, MD   Patient coming from: Home  I have personally briefly reviewed patient's old medical records in [place]  Chief Complaint: Generalized weakness, fatigue and exertional shortness of breath.  HPI: EMILLEE TALSMA is a 70 y.o. female with medical history significant of breast cancer, thyroid cancer which was cured, hypothyroidism, von Willebrand disease presented in the ED with complaints of fatigue, tiredness, intermittent palpitations and shortness of breath on exertion.  Patient reports the symptoms has been progressive and getting worse.  She denies any obvious or visible bleeding in the stool, urine, through nose or mouth.  Patient went to see her primary care physician who ordered some blood work, came out that her hemoglobin is 6.8 and she was sent in the emergency department.  Patient denies any chest pain but reports intermittent palpitations and shortness of breath on exertion.  She denies any fever, chills, weight loss, cough, recent travel, sick contacts.  She denies taking any NSAIDs or chronic pain medications.  She reports having colonoscopy twice and last Cologuard test was in 2020 which was negative for blood.   ED Course: In the ED she was hemodynamically stable except hypertension. HR 88, RR 16, BP 168/79, SPO2 100% on room air, temp 98.7. Labs BMP: Sodium 139, potassium 3.5, chloride 109, bicarb 22, glucose 99, BUN 14, creatinine 0.71, calcium 8.6, anion gap 8, albumin 4.4, AST 20, ALT 20, total protein 7.5.  CBC WBC 5.6 hemoglobin 6.7, hematocrit 23.7, MCV 79.5, platelet 389, PT and INR normal.  COVID-negative influenza negative stool for occult blood positive.  Review of Systems:  Review of Systems  Constitutional:  Positive for malaise/fatigue.  HENT: Negative.    Eyes: Negative.   Respiratory:  Positive for shortness of breath.   Cardiovascular:   Positive for palpitations.  Gastrointestinal:  Positive for blood in stool.  Genitourinary: Negative.   Musculoskeletal: Negative.   Skin: Negative.   Neurological: Negative.   Endo/Heme/Allergies: Negative.   Psychiatric/Behavioral: Negative.      Past Medical History:  Diagnosis Date   Cancer [facility])    breast and thyroid   Personal history of radiation therapy     Past Surgical History:  Procedure Laterality Date   ABDOMINAL HYSTERECTOMY     BREAST LUMPECTOMY Left 2008   BREAST LUMPECTOMY Right 2010   BREAST SURGERY     cesearian     THYROIDECTOMY       reports that she quit smoking about 45 years ago. She has never used smokeless tobacco. She reports current alcohol use of about 5.0 - 7.0 standard drinks of alcohol per week. She reports that she does not use drugs.  No Known Allergies  History reviewed. No pertinent family history. Family history reviewed and not pertinent .  Prior to Admission medications   Medication Sig Start Date End Date Taking? Authorizing Provider  levothyroxine (SYNTHROID, LEVOTHROID) 125 MCG tablet Take 125 mcg by mouth daily before breakfast.    [provider]    Physical Exam: Vitals:   05/05/21 1430 05/05/21 1540 05/05/21 1600 05/05/21 1638  BP: (!) 152/81 110/76 115/79 (!) 168/79  Pulse: 71 72 68 88  Resp: (!) 21 16 14  (!) 21  Temp:      TempSrc:      SpO2: 98% 100% 100% 100%  Weight:      Height:        Constitutional:  NAD, calm, comfortable Vitals:   05/05/21 1430 05/05/21 1540 05/05/21 1600 05/05/21 1638  BP: (!) 152/81 110/76 115/79 (!) 168/79  Pulse: 71 72 68 88  Resp: (!) 21 16 14  (!) 21  Temp:      TempSrc:      SpO2: 98% 100% 100% 100%  Weight:      Height:       Eyes: PERRL, lids and conjunctivae normal ENMT: Mucous membranes are moist. Posterior pharynx clear of any exudate or lesions.Normal dentition.  Neck: normal, supple, no masses, no thyromegaly Respiratory: clear to auscultation bilaterally, no  wheezing, no crackles. Normal respiratory effort. No accessory muscle use.  Cardiovascular: Regular rate and rhythm, no murmurs / rubs / gallops. No extremity edema. 2+ pedal pulses. No carotid bruits.  Abdomen: no tenderness, no masses palpated. No hepatosplenomegaly. Bowel sounds positive.  Musculoskeletal: no clubbing / cyanosis. No joint deformity upper and lower extremities. Good ROM, no contractures. Normal muscle tone.  Skin: no rashes, lesions, ulcers. No induration Neurologic: CN 2-12 grossly intact. Sensation intact, DTR normal. Strength 5/5 in all 4.  Psychiatric: Normal judgment and insight. Alert and oriented x 3. Normal mood.    Labs on Admission: I have personally reviewed following labs and imaging studies  CBC: Recent Labs  Lab 05/05/21 1316  WBC 5.6  NEUTROABS 3.4  HGB 6.7*  HCT 23.7*  MCV 79.5*  PLT 542   Basic Metabolic Panel: Recent Labs  Lab 05/05/21 1316  NA 139  K 3.5  CL 109  CO2 22  GLUCOSE 99  BUN 14  CREATININE 0.71  CALCIUM 8.6*   GFR: Estimated Creatinine Clearance: 58.5 mL/min (by C-G formula based on SCr of 0.71 mg/dL). Liver Function Tests: Recent Labs  Lab 05/05/21 1316  AST 20  ALT 20  ALKPHOS 78  BILITOT 0.5  PROT 7.5  ALBUMIN 4.4   No results for input(s): LIPASE, AMYLASE in the last 168 hours. No results for input(s): AMMONIA in the last 168 hours. Coagulation Profile: Recent Labs  Lab 05/05/21 1316  INR 1.0   Cardiac Enzymes: No results for input(s): CKTOTAL, CKMB, CKMBINDEX, TROPONINI in the last 168 hours. BNP (last 3 results) No results for input(s): PROBNP in the last 8760 hours. HbA1C: No results for input(s): HGBA1C in the last 72 hours. CBG: No results for input(s): GLUCAP in the last 168 hours. Lipid Profile: No results for input(s): CHOL, HDL, LDLCALC, TRIG, CHOLHDL, LDLDIRECT in the last 72 hours. Thyroid Function Tests: No results for input(s): TSH, T4TOTAL, FREET4, T3FREE, THYROIDAB in the last 72  hours. Anemia Panel: No results for input(s): VITAMINB12, FOLATE, FERRITIN, TIBC, IRON, RETICCTPCT in the last 72 hours. Urine analysis:    Component Value Date/Time   COLORURINE YELLOW 06/25/2016 2252   APPEARANCEUR CLOUDY (A) 06/25/2016 2252   LABSPEC 1.025 06/25/2016 2252   PHURINE 5.5 06/25/2016 2252   GLUCOSEU NEGATIVE 06/25/2016 2252   HGBUR NEGATIVE 06/25/2016 2252   BILIRUBINUR NEGATIVE 06/25/2016 2252   KETONESUR 15 (A) 06/25/2016 2252   PROTEINUR NEGATIVE 06/25/2016 2252   UROBILINOGEN 0.2 01/06/2008 1045   NITRITE NEGATIVE 06/25/2016 2252   LEUKOCYTESUR NEGATIVE 06/25/2016 2252    Radiological Exams on Admission: DG Chest 2 View  Result Date: 05/05/2021 CLINICAL DATA:  Shortness of breath EXAM: CHEST - 2 VIEW COMPARISON:  June 13, 2018 FINDINGS: No edema or airspace opacity. Heart size and pulmonary vascularity are normal. No adenopathy. Monitor overlies left chest. There is degenerative change in the thoracic spine.  IMPRESSION: Lungs clear.  Cardiac silhouette within normal limits. Electronically Signed   By: Lowella Grip III M.D.   On: 05/05/2021 13:24    EKG: Not completed and ordered.  Assessment/Plan Active Problems:   Acute GI bleeding   Acute on chronic blood loss anemia secondary to GI bleeding. Patient presented with progressive fatigue and shortness of breath on exertion. She is found to have Hb of 6.7 and was sent from PCPs office. Stool for occult blood is positive in the ER. She denies any obvious/ visible bleeding. She is hemodynamically stable. Last known Hb 12.5 gm  1-year ago. Continue Protonix 40 IV every 12 hours. Monitor H &H every 6 hours. Transfuse 2 units of packed red blood cells. GI consulted,  Dr. Watt Climes will see the patient. Continue clear liquid diet, will keep patient n.p.o. midnight in case she needs EGD in the morning. Patient denies any EGD in the past.  Hypothyroidism : continue levothyroxine.  DVT prophylaxis:  SCDs Code Status: Full code. Family Communication: No family at bed side. Disposition Plan:   Status is: Inpatient  Remains inpatient appropriate because:Inpatient level of care appropriate due to severity of illness  Dispo: The patient is from: Home              Anticipated d/c is to: Home              Patient currently is not medically stable to d/c.   Difficult to place patient No    Consults called: [facility] Dr. Watt Climes. Admission status: Inpatient    Shawna Clamp MD [facility]   If 7PM-7AM, please contact night-coverage [email or web address]   05/05/2021, 4:58 PM

## 2021-05-05 NOTE — ED Triage Notes (Signed)
Patient presents due to a hemoglobin level of 6.6. she found this out while at her MD office this AM. She complains of heart palpitations, SOB, muscle weakness and fatigue.

## 2021-05-05 NOTE — ED Notes (Signed)
Shawn PA made aware of critical hemoglobin 6.7.

## 2021-05-05 NOTE — ED Notes (Signed)
Pt states she had an EKG this morning and was told it was fine, and is also wearing a heart monitor. Pt wants to wait on EKG for now, unless it is completely necessary for her to have at this time.

## 2021-05-05 NOTE — Plan of Care (Signed)
?  Problem: Elimination: ?Goal: Will not experience complications related to urinary retention ?Outcome: Progressing ?  ?

## 2021-05-05 NOTE — ED Provider Notes (Addendum)
Emergency Medicine Provider Triage Evaluation Note  Alexis [name] , a 70 y.o. female  was evaluated in triage.  Pt complains of palpitations, shortness of breath, pallor, generalized weakness she thinks she first noted around May 28. She went to her PCP this morning, blood work was drawn, hemoglobin 6.6.  Review of Systems  Positive: Weakness, fatigue, palpitations, shortness of breath Negative: Hematochezia, melena, abdominal pain, syncope  Physical Exam  BP (!) 142/71   Pulse 67   Temp 98.7 F (37.1 C) (Oral)   Resp 16   Ht 5' (1.524 m)   Wt 69.4 kg   LMP  (LMP Unknown)   SpO2 97%   BMI 29.88 kg/m  Gen:   Awake, no distress   Resp:  Normal effort  MSK:   Moves extremities without difficulty  Other:  Pallor  Medical Decision Making  Medically screening exam initiated at 12:49 PM.  Appropriate orders placed.  TAMARI REDWINE was informed that the remainder of the evaluation will be completed by another provider, this initial triage assessment does not replace that evaluation, and the importance of remaining in the ED until their evaluation is complete.         Lorayne Bender, PA-C 05/05/21 1310    Lorayne Bender, PA-C 05/05/21 1327    Truddie Hidden, MD 05/05/21 [phone]

## 2021-05-05 NOTE — ED Notes (Signed)
Pt is on a 5 lead since she doesn't want an EKG.  She is wearing a ZOLL monitor and recently had an EKG done.

## 2021-05-05 NOTE — ED Provider Notes (Addendum)
[facility] DEPT Provider Note   CSN: [id number] Arrival date & time: 05/05/21  1053     History Chief Complaint  Patient presents with   Abnormal Lab   Weakness   Shortness of Breath   Palpitations    Alexis [name] is a 70 y.o. female presents to the ED for evaluation of generalized weakness, lightheadedness, shortness of breath with exertion, fatigue, muscle aches since May 27.  This has been gradually worsening.  She went to see her primary care doctor who drew blood work and told her that her hemoglobin was 6.6.  She was referred to the emergency room.  Denies any chest pain, syncope.  No obvious blood in her stool or melena.  She is not on any anticoagulants.  No hematemesis, bloody noses, hematuria.  She has history of von Willenbrand's disease "type 1", not on any medicines.  She does not take any ibuprofen products.  She is on a daily aspirin.  No interventions.  HPI     Past Medical History:  Diagnosis Date   Cancer [facility])    breast and thyroid   Personal history of radiation therapy     There are no problems to display for this patient.   Past Surgical History:  Procedure Laterality Date   ABDOMINAL HYSTERECTOMY     BREAST LUMPECTOMY Left 2008   BREAST LUMPECTOMY Right 2010   BREAST SURGERY     cesearian     THYROIDECTOMY       OB History   No obstetric history on file.     History reviewed. No pertinent family history.  Social History   Tobacco Use   Smoking status: Former    Pack years: 0.00    Types: Cigarettes    Quit date: 1977    Years since quitting: 45.4   Smokeless tobacco: Never   Tobacco comments:    Quit 40 years ago  Vaping Use   Vaping Use: Never used  Substance Use Topics   Alcohol use: Yes    Alcohol/week: 5.0 - 7.0 standard drinks    Types: 5 - 7 Standard drinks or equivalent per week    Comment: "I have about 1 cocktail a night"   Drug use: No    Home Medications Prior to Admission  medications   Medication Sig Start Date End Date Taking? Authorizing Provider  levothyroxine (SYNTHROID, LEVOTHROID) 125 MCG tablet Take 125 mcg by mouth daily before breakfast.    [provider]    Allergies    Patient has no known allergies.  Review of Systems   Review of Systems  Constitutional:  Positive for fatigue.  Respiratory:  Positive for shortness of breath.   Musculoskeletal:  Positive for myalgias.  Neurological:  Positive for light-headedness.  All other systems reviewed and are negative.  Physical Exam Updated Vital Signs BP 115/79   Pulse 68   Temp 98.7 F (37.1 C) (Oral)   Resp 14   Ht 5' (1.524 m)   Wt 69.4 kg   LMP  (LMP Unknown)   SpO2 100%   BMI 29.88 kg/m   Physical Exam Vitals and nursing note reviewed.  Constitutional:      General: She is not in acute distress.    Appearance: She is well-developed.     Comments: NAD.  HENT:     Head: Normocephalic and atraumatic.     Right Ear: External ear normal.     Left Ear: External ear normal.  Nose: Nose normal.  Eyes:     General: No scleral icterus.    Conjunctiva/sclera: Conjunctivae normal.  Cardiovascular:     Rate and Rhythm: Normal rate and regular rhythm.     Heart sounds: Normal heart sounds.  Pulmonary:     Effort: Pulmonary effort is normal.     Breath sounds: Normal breath sounds.  Genitourinary:    Rectum: Guaiac result positive.     Comments:  Chaperone EMT was present.  Patient with no pain in perianal/rectal area with palpation. 2 large hemorrhoids at 6 o"clock, without friability, bleeding or tenderness.  There are no external fissuresnoted. No induration or swelling of the perianal skin.   Stool color is brown with no gross blood noted.  No melena. No signs of perirectal abscess.  DRE reveals good sphincter tone.  Musculoskeletal:        General: No deformity. Normal range of motion.     Cervical back: Normal range of motion and neck supple.  Skin:    General:  Skin is warm and dry.     Capillary Refill: Capillary refill takes less than 2 seconds.  Neurological:     Mental Status: She is alert and oriented to person, place, and time.  Psychiatric:        Behavior: Behavior normal.        Thought Content: Thought content normal.        Judgment: Judgment normal.    ED Results / Procedures / Treatments   Labs (all labs ordered are listed, but only abnormal results are displayed) Labs Reviewed  COMPREHENSIVE METABOLIC PANEL - Abnormal; Notable for the following components:      Result Value   Calcium 8.6 (*)    All other components within normal limits  CBC WITH DIFFERENTIAL/PLATELET - Abnormal; Notable for the following components:   RBC 2.98 (*)    Hemoglobin 6.7 (*)    HCT 23.7 (*)    MCV 79.5 (*)    MCH 22.5 (*)    MCHC 28.3 (*)    RDW 17.1 (*)    All other components within normal limits  POC OCCULT BLOOD, ED - Abnormal; Notable for the following components:   Fecal Occult Bld POSITIVE (*)    All other components within normal limits  RESP PANEL BY RT-PCR (FLU A&B, COVID) ARPGX2  PROTIME-INR  TYPE AND SCREEN  ABO/RH  PREPARE RBC (CROSSMATCH)    EKG None  Radiology DG Chest 2 View  Result Date: 05/05/2021 CLINICAL DATA:  Shortness of breath EXAM: CHEST - 2 VIEW COMPARISON:  June 13, 2018 FINDINGS: No edema or airspace opacity. Heart size and pulmonary vascularity are normal. No adenopathy. Monitor overlies left chest. There is degenerative change in the thoracic spine. IMPRESSION: Lungs clear.  Cardiac silhouette within normal limits. Electronically Signed   By: Lowella Grip III M.D.   On: 05/05/2021 13:24    Procedures .Critical Care  Date/Time: 05/05/2021 4:27 PM Performed by: Kinnie Feil, PA-C Authorized by: Kinnie Feil, PA-C   Critical care provider statement:    Critical care time (minutes):  45   Critical care was time spent personally by me on the following activities:  Discussions with  consultants, evaluation of patient's response to treatment, examination of patient, ordering and performing treatments and interventions, ordering and review of laboratory studies, ordering and review of radiographic studies, pulse oximetry, re-evaluation of patient's condition, obtaining history from patient or surrogate, review of old charts and development of treatment  plan with patient or surrogate   I assumed direction of critical care for this patient from another provider in my specialty: no     Care discussed with: admitting provider     Medications Ordered in ED Medications  0.9 %  sodium chloride infusion (has no administration in time range)  pantoprazole (PROTONIX) injection 40 mg (40 mg Intravenous Given 05/05/21 1540)    ED Course  I have reviewed the triage vital signs and the nursing notes.  Pertinent labs & imaging results that were available during my care of the patient were reviewed by me and considered in my medical decision making (see chart for details).  Clinical Course as of 05/05/21 1651  Fri May 05, 2021  1446 Hemoglobin(!!): 6.7 [CG]  1446 HCT(!): 23.7 [CG]  1446 Fecal Occult Blood, POC(!): POSITIVE [CG]    Clinical Course User Index [CG] Kinnie Feil, PA-C   MDM Rules/Calculators/A&P                          70 year old female presents to the ED for exertional shortness of breath, lightheadedness, fatigue, myalgias for the last couple of weeks.  PCP obtained blood work that showed critically low hemoglobin 6.6 today.  EMR, triage nurse notes reviewed.  PCP is with [facility].  I do not see any documentation of any recent GI procedures upper EGD or colonoscopy.  Lab work, imaging ordered, reviewed and interpreted by me.  Labs reveal-hemoglobin 6.7, HCT 23.7.  POC Hemoccult is positive, brown stool.  Imaging reveals-EKG is not crossing over but no significant changes from previous tracing on hardcopy EKG.  Chest x-ray is  negative.  Medicines ordered in the ED-Protonix, 2 units PRBCs.  ED course and MDM 1625: Patient reevaluated and provided an update on work-up thus far and need for admission, she is in agreement.  I have messaged GI on-call Dr. Watt Climes, GI will see once admitted.  Pending consult to medicine for admission.  1650: Spoke to Dr Dwyane Dee who will admit patient.   Final Clinical Impression(s) / ED Diagnoses Final diagnoses:  Symptomatic anemia    Rx / DC Orders ED Discharge Orders     None        Kinnie Feil, PA-C 05/05/21 1628    Kinnie Feil, PA-C 05/05/21 1652    Truddie Hidden, MD 05/08/21 [phone]

## 2021-05-06 DIAGNOSIS — R040 Epistaxis: Secondary | ICD-10-CM

## 2021-05-06 DIAGNOSIS — D500 Iron deficiency anemia secondary to blood loss (chronic): Secondary | ICD-10-CM

## 2021-05-06 DIAGNOSIS — K922 Gastrointestinal hemorrhage, unspecified: Secondary | ICD-10-CM

## 2021-05-06 DIAGNOSIS — D680 Von Willebrand's disease: Secondary | ICD-10-CM

## 2021-05-06 LAB — CBC
HCT: 30.4 % — ABNORMAL LOW (ref 36.0–46.0)
Hemoglobin: 9.3 g/dL — ABNORMAL LOW (ref 12.0–15.0)
MCH: 24.3 pg — ABNORMAL LOW (ref 26.0–34.0)
MCHC: 30.6 g/dL (ref 30.0–36.0)
MCV: 79.6 fL — ABNORMAL LOW (ref 80.0–100.0)
Platelets: 322 10*3/uL (ref 150–400)
RBC: 3.82 MIL/uL — ABNORMAL LOW (ref 3.87–5.11)
RDW: 16.8 % — ABNORMAL HIGH (ref 11.5–15.5)
WBC: 5.5 10*3/uL (ref 4.0–10.5)
nRBC: 0 % (ref 0.0–0.2)

## 2021-05-06 LAB — TYPE AND SCREEN
ABO/RH(D): O POS
Antibody Screen: NEGATIVE
Unit division: 0
Unit division: 0

## 2021-05-06 LAB — COMPREHENSIVE METABOLIC PANEL
ALT: 17 U/L (ref 0–44)
AST: 21 U/L (ref 15–41)
Albumin: 4.1 g/dL (ref 3.5–5.0)
Alkaline Phosphatase: 70 U/L (ref 38–126)
Anion gap: 8 (ref 5–15)
BUN: 10 mg/dL (ref 8–23)
CO2: 22 mmol/L (ref 22–32)
Calcium: 8.6 mg/dL — ABNORMAL LOW (ref 8.9–10.3)
Chloride: 110 mmol/L (ref 98–111)
Creatinine, Ser: 0.62 mg/dL (ref 0.44–1.00)
GFR, Estimated: >60 mL/min (ref >60)
Glucose, Bld: 95 mg/dL (ref 70–99)
Potassium: 4.1 mmol/L (ref 3.5–5.1)
Sodium: 140 mmol/L (ref 135–145)
Total Bilirubin: 0.8 mg/dL (ref 0.3–1.2)
Total Protein: 6.8 g/dL (ref 6.5–8.1)

## 2021-05-06 LAB — HEMOGLOBIN AND HEMATOCRIT, BLOOD
HCT: 32.9 % — ABNORMAL LOW (ref 36.0–46.0)
HCT: 32.2 % — ABNORMAL LOW (ref 36.0–46.0)
HCT: 31.7 % — ABNORMAL LOW (ref 36.0–46.0)
Hemoglobin: 9.9 g/dL — ABNORMAL LOW (ref 12.0–15.0)
Hemoglobin: 9.9 g/dL — ABNORMAL LOW (ref 12.0–15.0)
Hemoglobin: 9.5 g/dL — ABNORMAL LOW (ref 12.0–15.0)

## 2021-05-06 LAB — BPAM RBC
Blood Product Expiration Date: 202207122359
Blood Product Expiration Date: 202207122359
ISSUE DATE / TIME: 202206101904
ISSUE DATE / TIME: 202206102321
Unit Type and Rh: 5100
Unit Type and Rh: 5100

## 2021-05-06 MED ORDER — PEG 3350-KCL-NA BICARB-NACL 420 G PO SOLR
4000.0000 mL | Freq: Once | ORAL | Status: AC
  Administered 2021-05-07: 4000 mL via ORAL

## 2021-05-06 MED ORDER — SODIUM CHLORIDE 0.9 % IV SOLN
510.0000 mg | Freq: Once | INTRAVENOUS | Status: AC
  Administered 2021-05-06: 510 mg via INTRAVENOUS
  Filled 2021-05-06: qty 17

## 2021-05-06 MED ORDER — SODIUM CHLORIDE 0.9 % IV SOLN
20.0000 ug | Freq: Once | INTRAVENOUS | Status: AC
  Administered 2021-05-08: 20 ug via INTRAVENOUS
  Filled 2021-05-06: qty 5

## 2021-05-06 NOTE — Consult Note (Signed)
Referring Provider: [facility] Primary Care Physician:  Merrilee Seashore, MD Primary Gastroenterologist:  Dr. Wilford Corner  Reason for Consultation: Heme positive stool and anemia  HPI: Alexis [name] is a 70 y.o. female who was admitted through the emergency room yesterday with symptomatic anemia, hemoglobin 6.8, MCV borderline low at 80, anemia panel not performed.  She has since been transfused 2 units of packed cells, with an appropriate posttransfusion rise in hemoglobin to 9.3.  Review of our office record indicates that the patient had a solitary diminutive adenoma removed by Dr. Noland Fordyce in November 2004, with a surveillance colonoscopy by Dr. Cannon Kettle in June 2011 that was negative for any polyps.  The patient was then lost to follow-up despite attempts to reach her to arrange colonoscopic surveillance.  In the meantime, she had immunochemical fecal occult blood testing that was negative in August 2020, but a test ordered in September 2021 was never returned.  The patient does have moderately severe von Willebrand's disease which is followed at [facility].  The patient was apparently not on any ulcerogenic medications or anticoagulation prior to admission.  The patient denies any upper tract symptoms other than occasional heartburn, such as dysphagia, anorexia, unintentional weight loss, nausea, or significant abdominal pain.  Also denies lower tract symptoms such as rectal bleeding or black stools or significant change in bowel habits.   Past Medical History:  Diagnosis Date   Cancer [facility])    breast and thyroid   Personal history of radiation therapy     Past Surgical History:  Procedure Laterality Date   ABDOMINAL HYSTERECTOMY     BREAST LUMPECTOMY Left 2008   BREAST LUMPECTOMY Right 2010   BREAST SURGERY     cesearian     THYROIDECTOMY      Prior to Admission medications   Medication Sig Start Date End Date Taking? Authorizing Provider   levothyroxine (SYNTHROID, LEVOTHROID) 125 MCG tablet Take 125 mcg by mouth daily before breakfast.   Yes [provider]    Current Facility-Administered Medications  Medication Dose Route Frequency Provider Last Rate Last Admin   0.9 %  sodium chloride infusion  10 mL/hr Intravenous Once Gibbons, Claudia J, PA-C       0.9 %  sodium chloride infusion   Intravenous Continuous Shawna Clamp, MD 50 mL/hr at 05/06/21 0345 New Bag at 05/06/21 0345   acetaminophen (TYLENOL) tablet 650 mg  650 mg Oral Q6H PRN Shawna Clamp, MD       Or   acetaminophen (TYLENOL) suppository 650 mg  650 mg Rectal Q6H PRN Shawna Clamp, MD       docusate sodium (COLACE) capsule 100 mg  100 mg Oral BID Shawna Clamp, MD   100 mg at 05/05/21 2125   ferumoxytol (FERAHEME) 510 mg in sodium chloride 0.9 % 100 mL IVPB  510 mg Intravenous Once Alvy Bimler, Ni, MD       levothyroxine (SYNTHROID) tablet 125 mcg  125 mcg Oral Q0600 Shawna Clamp, MD   125 mcg at 05/06/21 0540   ondansetron (ZOFRAN) tablet 4 mg  4 mg Oral Q6H PRN Shawna Clamp, MD       Or   ondansetron [facility]) injection 4 mg  4 mg Intravenous Q6H PRN Shawna Clamp, MD       pantoprazole (PROTONIX) injection 40 mg  40 mg Intravenous Q12H Shawna Clamp, MD   40 mg at 05/06/21 1003    Allergies as of 05/05/2021   (No Known Allergies)  History reviewed. No pertinent family history.  Social History   Socioeconomic History   Marital status: Married    Spouse name: Not on file   Number of children: Not on file   Years of education: Not on file   Highest education level: Not on file  Occupational History   Not on file  Tobacco Use   Smoking status: Former    Pack years: 0.00    Types: Cigarettes    Quit date: 61    Years since quitting: 45.4   Smokeless tobacco: Never   Tobacco comments:    Quit 40 years ago  Vaping Use   Vaping Use: Never used  Substance and Sexual Activity   Alcohol use: Yes    Alcohol/week: 5.0 - 7.0  standard drinks    Types: 5 - 7 Standard drinks or equivalent per week    Comment: "I have about 1 cocktail a night"   Drug use: No   Sexual activity: Yes    Birth control/protection: None  Other Topics Concern   Not on file  Social History Narrative   Not on file   Social Determinants of Health   Financial Resource Strain: Not on file  Food Insecurity: Not on file  Transportation Needs: Not on file  Physical Activity: Not on file  Stress: Not on file  Social Connections: Not on file  Intimate Partner Violence: Not on file    Review of Systems:   Physical Exam: Vital signs in last 24 hours: Temp:  [97.7 F (36.5 C)-98.7 F (37.1 C)] 98.6 F (37 C) (06/11 0640) Pulse Rate:  [64-88] 64 (06/11 0640) Resp:  [14-21] 18 (06/11 0640) BP: (110-183)/(58-85) 141/68 (06/11 0640) SpO2:  [97 %-100 %] 97 % (06/11 0640) Last BM Date: 05/05/21 General:   Alert,  Well-developed, well-nourished, pleasant and cooperative in NAD Head:  Normocephalic and atraumatic. Eyes:  Sclera clear, no icterus.   Conjunctiva pink. Lungs:  Clear throughout to auscultation.   No wheezes, crackles, or rhonchi. No evident respiratory distress. Heart:   Regular rate and rhythm; 3/6 systolic murmur Abdomen:  Soft, nontender, nontympanitic, and nondistended. No masses, hepatosplenomegaly or ventral hernias noted. Msk:   Symmetrical without gross deformities. Extremities:   Without clubbing, cyanosis, or edema. Neurologic:  Alert and coherent;  grossly normal neurologically. Skin:  Intact without significant lesions or rashes. Psych:   Alert and cooperative. Normal mood and affect.  Intake/Output from previous day: 06/10 0701 - 06/11 0700 In: 1326.6 [I.V.:696.6; Blood:630] Out: -  Intake/Output this shift: No intake/output data recorded.  Lab Results: Recent Labs    05/05/21 1316 05/05/21 1653 05/06/21 0454 05/06/21 1054  WBC 5.6  --  5.5  --   HGB 6.7* 6.3* 9.3* 9.9*  HCT 23.7* 21.6* 30.4*  32.9*  PLT 389  --  322  --    BMET Recent Labs    05/05/21 1316 05/06/21 0454  NA 139 140  K 3.5 4.1  CL 109 110  CO2 22 22  GLUCOSE 99 95  BUN 14 10  CREATININE 0.71 0.62  CALCIUM 8.6* 8.6*   LFT Recent Labs    05/06/21 0454  PROT 6.8  ALBUMIN 4.1  AST 21  ALT 17  ALKPHOS 70  BILITOT 0.8   PT/INR Recent Labs    05/05/21 1316  LABPROT 12.8  INR 1.0    Studies/Results: DG Chest 2 View  Result Date: 05/05/2021 CLINICAL DATA:  Shortness of breath EXAM: CHEST - 2 VIEW COMPARISON:  June 13, 2018 FINDINGS: No edema or airspace opacity. Heart size and pulmonary vascularity are normal. No adenopathy. Monitor overlies left chest. There is degenerative change in the thoracic spine. IMPRESSION: Lungs clear.  Cardiac silhouette within normal limits. Electronically Signed   By: Lowella Grip III M.D.   On: 05/05/2021 13:24    Impression: 1.  Severe symptomatic anemia with borderline microcytic indices, cause unclear given absence of localizing symptoms or risk factors for blood clots 2.  Heme positive stool (brown stool without visible blood noted on digital exam in the emergency room); thus, no evidence of active for subacute bleeding, but rather, this appears to be an issue of chronic low-grade GI blood loss 3.  Remote history of solitary diminutive adenoma, last surveillance colonoscopy was 11 years ago 4.  Von Willebrand's disease of moderate severity  Plan: 1.  Endoscopy and colonoscopy on Monday.  I have reviewed the nature, purpose, risks, and alternatives of these exams with the patient and she is agreeable.  2.  Hematology consultation to assist with management of DDAVP in association with the upcoming endoscopic procedures.  Most likely, it will be infused following the procedure if biopsies or polypectomy are performed, or if significant hemorrhagic-risk findings are noted during the exam.   LOS: 1 day   Youlanda Mighty Ramzy Cappelletti  05/06/2021, 12:05 PM   Pager  [phone] If no answer or after 5 PM call [phone]

## 2021-05-06 NOTE — Progress Notes (Signed)
PROGRESS NOTE    Alexis [name]  HQI:[id number] DOB: [birth year] DOA: 05/05/2021 PCP: Merrilee Seashore, MD    Brief Narrative:  This 70 years old female with PMH significant for breast cancer, thyroid cancer which was cured, hypothyroidism, Von Willbrand disease presented in the ED with complaints of fatigue, tiredness ,intermittent palpitation and shortness of breath on exertion. She denies any obvious or visible bleeding in the stools, urine, through nose or mouth.  She went to see her primary care physician who has ordered some blood work came out  Her Hb 6.8, She denies using any NSAIDs or chronic pain medications.  Patient has received 2 units of packed red blood cells,  her hemoglobin has improved to 9.9.  She is hemodynamically stable. GI is consulted recommended EGD and colonoscopy on Monday.  Patient was also seen by hematologist, recommended DDVAP infusion before EGD.  Assessment & Plan:   Active Problems:   Acute GI bleeding  Acute on chronic blood loss anemia secondary to GI bleeding. Patient presented with progressive fatigue and shortness of breath on exertion. She is found to have Hb of 6.7 and was sent from PCPs office. Stool for occult blood is positive in the ER. She denies any obvious/ visible bleeding. She is hemodynamically stable. Last known Hb 12.5 gm  1-year ago. Continue Protonix 40 IV every 12 hours. Monitor H &H every 6 hours. S/p 2 PRBC  , Hb  9.9 GI consulted, Dr. Cristina Gong has recommended EGD and colonoscopy on Monday. Continue clear liquid diet, n.p.o. Sunday midnight. Patient denies any EGD in the past.   Hypothyroidism : continue levothyroxine.  History of von Willebrand disease; Patient does have a history of bleeding during thyroid and breast surgery. Hematology consulted,  recommended DDAVP infusion before EGD.    DVT prophylaxis: SCDs Code Status: Full code. Family Communication:  No family at bed side. Disposition Plan:   Status is:  Inpatient  Remains inpatient appropriate because:Inpatient level of care appropriate due to severity of illness  Dispo: The patient is from: Home              Anticipated d/c is to: Home              Patient currently is not medically stable to d/c.   Difficult to place patient No   Consultants:  GI Hematology  Procedures:  Antimicrobials: (  Anti-infectives (From admission, onward)    None        Subjective: Patient was seen and examined at bedside.  Overnight events noted.  Patient reports feeling better,  she denies any bleeding in the stools or in the urine.  She denies any  abdominal  pain.  Objective: Vitals:   05/06/21 0045 05/06/21 0242 05/06/21 0640 05/06/21 1235  BP: (!) 176/85 (!) 151/74 (!) 141/68 (!) 163/77  Pulse:  64 64 62  Resp:  16 18 16   Temp:  97.9 F (36.6 C) 98.6 F (37 C) 98.5 F (36.9 C)  TempSrc:  Oral Oral   SpO2:  98% 97% 99%  Weight:      Height:        Intake/Output Summary (Last 24 hours) at 05/06/2021 1349 Last data filed at 05/06/2021 0500 Gross per 24 hour  Intake 1326.58 ml  Output --  Net 1326.58 ml   Filed Weights   05/05/21 1145  Weight: 69.4 kg    Examination:  General exam: Appears calm and comfortable, not in any acute distress. Respiratory system: Clear to auscultation.  Respiratory effort normal. Cardiovascular system: S1 & S2 heard, RRR. No JVD, murmurs, rubs, gallops or clicks. No pedal edema. Gastrointestinal system: Abdomen is nondistended, soft and nontender. No organomegaly or masses felt. Normal bowel sounds heard. Central nervous system: Alert and oriented. No focal neurological deficits. Extremities: Symmetric 5 x 5 power.  No edema, no cyanosis, no clubbing. Skin: No rashes, lesions or ulcers Psychiatry: Judgement and insight appear normal. Mood & affect appropriate.     Data Reviewed: I have personally reviewed following labs and imaging studies  CBC: Recent Labs  Lab 05/05/21 1316 05/05/21 1653  05/06/21 0454 05/06/21 1054  WBC 5.6  --  5.5  --   NEUTROABS 3.4  --   --   --   HGB 6.7* 6.3* 9.3* 9.9*  HCT 23.7* 21.6* 30.4* 32.9*  MCV 79.5*  --  79.6*  --   PLT 389  --  322  --    Basic Metabolic Panel: Recent Labs  Lab 05/05/21 1316 05/06/21 0454  NA 139 140  K 3.5 4.1  CL 109 110  CO2 22 22  GLUCOSE 99 95  BUN 14 10  CREATININE 0.71 0.62  CALCIUM 8.6* 8.6*   GFR: Estimated Creatinine Clearance: 58.5 mL/min (by C-G formula based on SCr of 0.62 mg/dL). Liver Function Tests: Recent Labs  Lab 05/05/21 1316 05/06/21 0454  AST 20 21  ALT 20 17  ALKPHOS 78 70  BILITOT 0.5 0.8  PROT 7.5 6.8  ALBUMIN 4.4 4.1   No results for input(s): LIPASE, AMYLASE in the last 168 hours. No results for input(s): AMMONIA in the last 168 hours. Coagulation Profile: Recent Labs  Lab 05/05/21 1316  INR 1.0   Cardiac Enzymes: No results for input(s): CKTOTAL, CKMB, CKMBINDEX, TROPONINI in the last 168 hours. BNP (last 3 results) No results for input(s): PROBNP in the last 8760 hours. HbA1C: No results for input(s): HGBA1C in the last 72 hours. CBG: No results for input(s): GLUCAP in the last 168 hours. Lipid Profile: No results for input(s): CHOL, HDL, LDLCALC, TRIG, CHOLHDL, LDLDIRECT in the last 72 hours. Thyroid Function Tests: No results for input(s): TSH, T4TOTAL, FREET4, T3FREE, THYROIDAB in the last 72 hours. Anemia Panel: No results for input(s): VITAMINB12, FOLATE, FERRITIN, TIBC, IRON, RETICCTPCT in the last 72 hours. Sepsis Labs: No results for input(s): PROCALCITON, LATICACIDVEN in the last 168 hours.  Recent Results (from the past 240 hour(s))  Resp Panel by RT-PCR (Flu A&B, Covid) Nasopharyngeal Swab     Status: None   Collection Time: 05/05/21 12:53 PM   Specimen: Nasopharyngeal Swab; Nasopharyngeal(NP) swabs in vial transport medium  Result Value Ref Range Status   SARS Coronavirus 2 by RT PCR NEGATIVE NEGATIVE Final    Comment: (NOTE) SARS-CoV-2  target nucleic acids are NOT DETECTED.  The SARS-CoV-2 RNA is generally detectable in upper respiratory specimens during the acute phase of infection. The lowest concentration of SARS-CoV-2 viral copies this assay can detect is 138 copies/mL. A negative result does not preclude SARS-Cov-2 infection and should not be used as the sole basis for treatment or other patient management decisions. A negative result may occur with  improper specimen collection/handling, submission of specimen other than nasopharyngeal swab, presence of viral mutation(s) within the areas targeted by this assay, and inadequate number of viral copies(<138 copies/mL). A negative result must be combined with clinical observations, patient history, and epidemiological information. The expected result is Negative.  Fact Sheet for Patients:  EntrepreneurPulse.com.au  Fact Sheet for  Healthcare Providers:  IncredibleEmployment.be  This test is no t yet approved or cleared by the [place] and  has been authorized for detection and/or diagnosis of SARS-CoV-2 by FDA under an Emergency Use Authorization (EUA). This EUA will remain  in effect (meaning this test can be used) for the duration of the COVID-19 declaration under Section 564(b)(1) of the Act, 21 U.S.C.section 360bbb-3(b)(1), unless the authorization is terminated  or revoked sooner.       Influenza A by PCR NEGATIVE NEGATIVE Final   Influenza B by PCR NEGATIVE NEGATIVE Final    Comment: (NOTE) The Xpert Xpress SARS-CoV-2/FLU/RSV plus assay is intended as an aid in the diagnosis of influenza from Nasopharyngeal swab specimens and should not be used as a sole basis for treatment. Nasal washings and aspirates are unacceptable for Xpert Xpress SARS-CoV-2/FLU/RSV testing.  Fact Sheet for Patients: EntrepreneurPulse.com.au  Fact Sheet for Healthcare  Providers: IncredibleEmployment.be  This test is not yet approved or cleared by the [place] FDA and has been authorized for detection and/or diagnosis of SARS-CoV-2 by FDA under an Emergency Use Authorization (EUA). This EUA will remain in effect (meaning this test can be used) for the duration of the COVID-19 declaration under Section 564(b)(1) of the Act, 21 U.S.C. section 360bbb-3(b)(1), unless the authorization is terminated or revoked.  Performed at [facility], [place] [address], [place], [place] [address]      Radiology Studies: DG Chest 2 View  Result Date: 05/05/2021 CLINICAL DATA:  Shortness of breath EXAM: CHEST - 2 VIEW COMPARISON:  June 13, 2018 FINDINGS: No edema or airspace opacity. Heart size and pulmonary vascularity are normal. No adenopathy. Monitor overlies left chest. There is degenerative change in the thoracic spine. IMPRESSION: Lungs clear.  Cardiac silhouette within normal limits. Electronically Signed   By: Lowella Grip III M.D.   On: 05/05/2021 13:24     Scheduled Meds:  docusate sodium  100 mg Oral BID   levothyroxine  125 mcg Oral Q0600   pantoprazole (PROTONIX) IV  40 mg Intravenous Q12H   Continuous Infusions:  sodium chloride     sodium chloride 50 mL/hr at 05/06/21 0345   [START ON 05/08/2021] desmopressin (DDAVP) IV for Bleeding       LOS: 1 day    Time spent: 25 mins    Juel Bellerose, MD [facility]   If 7PM-7AM, please contact night-coverage

## 2021-05-06 NOTE — Consult Note (Signed)
[facility] CONSULT NOTE  Patient Care Team: Merrilee Seashore, MD as PCP - General (Internal Medicine)  ASSESSMENT & PLAN:  Iron deficiency anemia, likely due to upper GI bleed until proven otherwise The patient is aware that she should not be taking aspirin or NSAID containing products Her hemoglobin has stabilized after blood transfusion  Given her significant GI bleed requiring blood transfusion, we have extensive discussion about the risk and benefits of factor replacement therapy The patient declines Her posttransfusion hemoglobin is stable She is in agreement if her next 24-hour hemoglobin monitoring show deterioration, she would be more receptive to receiving factor replacement therapy For today, she is in agreement for IV iron The most likely cause of her anemia is due to chronic blood loss/malabsorption syndrome. We discussed some of the risks, benefits, and alternatives of intravenous iron infusions. The patient is symptomatic from anemia and the iron level is critically low. She tolerated oral iron supplement poorly and desires to achieved higher levels of iron faster for adequate hematopoesis. Some of the side-effects to be expected including risks of infusion reactions, phlebitis, headaches, nausea and fatigue.  The patient is willing to proceed. Patient education material was dispensed.  Goal is to keep ferritin level greater than 50 and resolution of anemia  Type I von Willebrand disease The patient is not consistent with follow-up with hematologist She has variable bleeding history in the past She had significant bleeding after thyroid surgery and breast surgery but tolerated colonoscopy x2 well even with polyp removal She had knee surgery in 2019 without posttreatment factor infusion She did well with IV DDAVP prior to procedures According to GI consult service, she will likely have her procedure around Monday afternoon I recommend DDAVP infusion at 0.3  mcg/kg X 1 (approximately 20 mcg) at 6 a.m. Monday morning (order is placed) *Monitor Sodium level Tuesday morning  If she has recurrent active bleeding and hemoglobin drops to less than 8 over the next 24-48 hours, I would recommend Humate P infusion Dose for Humate P: 30 units/kg (approximately 2000 units) every 12 to 24 hours for 1-2 doses  The total time spent in the appointment was 80 minutes encounter with patients including review of chart and various tests results, discussions about plan of care and coordination of care plan   All questions were answered. The patient knows to call the clinic with any problems, questions or concerns. No barriers to learning was detected.  Heath Lark, MD 6/11/202211:50 AM  CHIEF COMPLAINTS/PURPOSE OF CONSULTATION:  Von Willebrand disease type I, mild   HISTORY OF PRESENTING ILLNESS:  Alexis [name] 70 y.o. female is seen as a consult per request from gastroenterologist for evaluation and perioperative management for upcoming EGD and colonoscopy  The patient is known to have type I von Willebrand disease She was seen by Dr. Marin Olp in 2017 and Dr. Claiborne Billings in 2019 She was also seen at [facility] 2 years ago for evaluation of the same thing In the past, she had numerous surgeries including 5 vaginal birth, C-sections, thyroid surgery, breast biopsy and knee surgery She had 2 colonoscopy with polyps removal in the past without pretreatment DDAVP The last time she was seen by Dr. Claiborne Billings was in 2019 prior to her knee surgery.  From what I gather, she might have received IV DDAVP before the surgery but did not receive anything after She has never received blood transfusion for bleeding complications  I have reviewed her test results from  1) December 28, 2015 Factor VIII activity at 24% Von Willebrand factor antigen level at 12% Von Willebrand factor activity level at 13% 2) December 24, 2017  Factor VIII activity at 32% Ristocetin cofactor 11% Von  Willebrand antigen level 14%  With this hospitalization, she was found to have severe anemia requiring blood transfusion The patient denies any recent signs or symptoms of bleeding such as spontaneous epistaxis, hematuria or hematochezia. She denies pica.  She eats a variety of diet including meat She has chronic intermittent arthritis pain and has taken periodic 325 mg aspirin alternating with ibuprofen maybe once a week She denies heartburn or gastric reflux  She presented to the emergency department yesterday with palpitation, shortness of breath and generalized weakness.  There symptoms have been going on for a week or 2.  In the emergency department, she was noted to be profoundly anemic.  In July 2019, she had normal hemoglobin of 14.1 On admission on May 05, 2021, hemoglobin was low at 6.7 with low MCV, repeated again low at 6.3.  After blood transfusion, her hemoglobin is 9.3 this morning and stabilized at 9.9 around 11:00  MEDICAL HISTORY:  Past Medical History:  Diagnosis Date   Cancer ([place])    breast and thyroid   Personal history of radiation therapy     SURGICAL HISTORY: Past Surgical History:  Procedure Laterality Date   ABDOMINAL HYSTERECTOMY     BREAST LUMPECTOMY Left 2008   BREAST LUMPECTOMY Right 2010   BREAST SURGERY     cesearian     THYROIDECTOMY      SOCIAL HISTORY: Social History   Socioeconomic History   Marital status: Married    Spouse name: Not on file   Number of children: Not on file   Years of education: Not on file   Highest education level: Not on file  Occupational History   Not on file  Tobacco Use   Smoking status: Former    Pack years: 0.00    Types: Cigarettes    Quit date: 22    Years since quitting: 45.4   Smokeless tobacco: Never   Tobacco comments:    Quit 40 years ago  Vaping Use   Vaping Use: Never used  Substance and Sexual Activity   Alcohol use: Yes    Alcohol/week: 5.0 - 7.0 standard drinks    Types: 5 - 7  Standard drinks or equivalent per week    Comment: "I have about 1 cocktail a night"   Drug use: No   Sexual activity: Yes    Birth control/protection: None  Other Topics Concern   Not on file  Social History Narrative   Not on file   Social Determinants of Health   Financial Resource Strain: Not on file  Food Insecurity: Not on file  Transportation Needs: Not on file  Physical Activity: Not on file  Stress: Not on file  Social Connections: Not on file  Intimate Partner Violence: Not on file    FAMILY HISTORY: At least 3 of her children has von Willebrand's disease History reviewed. No pertinent family history.  ALLERGIES:  has No Known Allergies.  MEDICATIONS:  Current Facility-Administered Medications  Medication Dose Route Frequency Provider Last Rate Last Admin   0.9 %  sodium chloride infusion  10 mL/hr Intravenous Once Donney Rankins, Claudia J, PA-C       0.9 %  sodium chloride infusion   Intravenous Continuous Shawna Clamp, MD 50 mL/hr at 05/06/21 0345 New Bag at 05/06/21 0345  acetaminophen (TYLENOL) tablet 650 mg  650 mg Oral Q6H PRN Shawna Clamp, MD       Or   acetaminophen (TYLENOL) suppository 650 mg  650 mg Rectal Q6H PRN Shawna Clamp, MD       docusate sodium (COLACE) capsule 100 mg  100 mg Oral BID Shawna Clamp, MD   100 mg at 05/05/21 2125   ferumoxytol (FERAHEME) 510 mg in sodium chloride 0.9 % 100 mL IVPB  510 mg Intravenous Once Heath Lark, MD       levothyroxine (SYNTHROID) tablet 125 mcg  125 mcg Oral Q0600 Shawna Clamp, MD   125 mcg at 05/06/21 0540   ondansetron (ZOFRAN) tablet 4 mg  4 mg Oral Q6H PRN Shawna Clamp, MD       Or   ondansetron [facility]) injection 4 mg  4 mg Intravenous Q6H PRN Shawna Clamp, MD       pantoprazole (PROTONIX) injection 40 mg  40 mg Intravenous Q12H Shawna Clamp, MD   40 mg at 05/06/21 1003    REVIEW OF SYSTEMS:   Constitutional: Denies fevers, chills or abnormal night sweats Eyes: Denies blurriness of vision,  double vision or watery eyes Ears, nose, mouth, throat, and face: Denies mucositis or sore throat Respiratory: Denies cough, dyspnea or wheezes Cardiovascular: Denies palpitation, chest discomfort or lower extremity swelling Gastrointestinal:  Denies nausea, heartburn or change in bowel habits Skin: Denies abnormal skin rashes Lymphatics: Denies new lymphadenopathy or easy bruising Neurological:Denies numbness, tingling or new weaknesses Behavioral/Psych: Mood is stable, no new changes  All other systems were reviewed with the patient and are negative.  PHYSICAL EXAMINATION: ECOG PERFORMANCE STATUS: 1 - Symptomatic but completely ambulatory  Vitals:   05/06/21 0242 05/06/21 0640  BP: (!) 151/74 (!) 141/68  Pulse: 64 64  Resp: 16 18  Temp: 97.9 F (36.6 C) 98.6 F (37 C)  SpO2: 98% 97%   Filed Weights   05/05/21 1145  Weight: 153 lb (69.4 kg)    GENERAL:alert, no distress and comfortable.  She looks a bit pale SKIN: skin color, texture, turgor are normal, no rashes or significant lesions EYES: normal, conjunctiva are pink and non-injected, sclera clear OROPHARYNX:no exudate, no erythema and lips, buccal mucosa, and tongue normal  NECK: supple, thyroid normal size, non-tender, without nodularity LYMPH:  no palpable lymphadenopathy in the cervical, axillary or inguinal LUNGS: clear to auscultation and percussion with normal breathing effort HEART: regular rate & rhythm and no murmurs and no lower extremity edema ABDOMEN:abdomen soft, non-tender and normal bowel sounds Musculoskeletal:no cyanosis of digits and no clubbing  PSYCH: alert & oriented x 3 with fluent speech NEURO: no focal motor/sensory deficits  LABORATORY DATA:  I have reviewed the data as listed Lab Results  Component Value Date   WBC 5.5 05/06/2021   HGB 9.9 (L) 05/06/2021   HCT 32.9 (L) 05/06/2021   MCV 79.6 (L) 05/06/2021   PLT 322 05/06/2021   Recent Labs    05/05/21 1316 05/06/21 0454  NA 139  140  K 3.5 4.1  CL 109 110  CO2 22 22  GLUCOSE 99 95  BUN 14 10  CREATININE 0.71 0.62  CALCIUM 8.6* 8.6*  GFRNONAA >60 >60  PROT 7.5 6.8  ALBUMIN 4.4 4.1  AST 20 21  ALT 20 17  ALKPHOS 78 70  BILITOT 0.5 0.8    RADIOGRAPHIC STUDIES: I have personally reviewed the radiological images as listed and agreed with the findings in the report. DG Chest 2 View  Result Date: 05/05/2021 CLINICAL DATA:  Shortness of breath EXAM: CHEST - 2 VIEW COMPARISON:  June 13, 2018 FINDINGS: No edema or airspace opacity. Heart size and pulmonary vascularity are normal. No adenopathy. Monitor overlies left chest. There is degenerative change in the thoracic spine. IMPRESSION: Lungs clear.  Cardiac silhouette within normal limits. Electronically Signed   By: Lowella Grip III M.D.   On: 05/05/2021 13:24

## 2021-05-07 LAB — BASIC METABOLIC PANEL
Anion gap: 7 (ref 5–15)
BUN: 17 mg/dL (ref 8–23)
CO2: 22 mmol/L (ref 22–32)
Calcium: 8.7 mg/dL — ABNORMAL LOW (ref 8.9–10.3)
Chloride: 109 mmol/L (ref 98–111)
Creatinine, Ser: 0.81 mg/dL (ref 0.44–1.00)
GFR, Estimated: >60 mL/min (ref >60)
Glucose, Bld: 108 mg/dL — ABNORMAL HIGH (ref 70–99)
Potassium: 3.7 mmol/L (ref 3.5–5.1)
Sodium: 138 mmol/L (ref 135–145)

## 2021-05-07 LAB — CBC
HCT: 32.9 % — ABNORMAL LOW (ref 36.0–46.0)
Hemoglobin: 9.9 g/dL — ABNORMAL LOW (ref 12.0–15.0)
MCH: 24 pg — ABNORMAL LOW (ref 26.0–34.0)
MCHC: 30.1 g/dL (ref 30.0–36.0)
MCV: 79.7 fL — ABNORMAL LOW (ref 80.0–100.0)
Platelets: 359 10*3/uL (ref 150–400)
RBC: 4.13 MIL/uL (ref 3.87–5.11)
RDW: 17.1 % — ABNORMAL HIGH (ref 11.5–15.5)
WBC: 5.7 10*3/uL (ref 4.0–10.5)
nRBC: 0 % (ref 0.0–0.2)

## 2021-05-07 LAB — HEMOGLOBIN AND HEMATOCRIT, BLOOD
HCT: 32 % — ABNORMAL LOW (ref 36.0–46.0)
HCT: 36.1 % (ref 36.0–46.0)
Hemoglobin: 10 g/dL — ABNORMAL LOW (ref 12.0–15.0)
Hemoglobin: 10.7 g/dL — ABNORMAL LOW (ref 12.0–15.0)

## 2021-05-07 NOTE — Progress Notes (Signed)
PROGRESS NOTE    Alexis [name]  TIW:[id number] DOB: 04-24-1951 DOA: 05/05/2021 PCP: Merrilee Seashore, MD    Brief Narrative:  This 70 years old female with PMH significant for breast cancer, thyroid cancer which was cured, hypothyroidism, Von Willbrand disease presented in the ED with complaints of fatigue, tiredness ,intermittent palpitation and shortness of breath on exertion. She denies any obvious or visible bleeding in the stools, urine, through nose or mouth.  She went to see her primary care physician who has ordered some blood work came out  Her Hb 6.8, She denies using any NSAIDs or chronic pain medications.  Patient has received 2 units of packed red blood cells,  her hemoglobin has improved to 9.9.  She is hemodynamically stable. GI is consulted recommended EGD and colonoscopy on Monday.  Patient was also seen by hematologist, recommended DDVAP infusion before EGD.  Assessment & Plan:   Active Problems:   Acute GI bleeding  Acute on chronic blood loss anemia secondary to GI bleeding. Patient presented with progressive fatigue and shortness of breath on exertion. She is found to have Hb of 6.7 and was sent from PCPs office. Stool for occult blood is positive in the ER. She denies any obvious/ visible bleeding. She is hemodynamically stable. Last known Hb 12.5 gm  1-year ago. Continue Protonix 40 IV every 12 hours. Monitor H &H every 6 hours. S/p 2 PRBC  , Hb  9.9 > 10.2 as expected. GI consulted, Dr. Cristina Gong has recommended EGD and colonoscopy on Monday by Dr. Therisa Doyne Continue clear liquid diet, n.p.o. Sunday midnight. Patient denies any EGD in the past.    Hypothyroidism : continue levothyroxine.  History of von Willebrand disease; Patient does have a history of bleeding during thyroid and breast surgery. Hematology consulted,  recommended DDAVP infusion before EGD. If no biopsy or intervention is performed, she can be discharged Monday evening. However, if biopsy  or intervention is performed, Heme recommend to stay overnight to get her blood counts checked Tuesday morning.   DVT prophylaxis: SCDs Code Status: Full code. Family Communication:  No family at bed side. Disposition Plan:   Status is: Inpatient  Remains inpatient appropriate because:Inpatient level of care appropriate due to severity of illness  Dispo: The patient is from: Home              Anticipated d/c is to: Home              Patient currently is not medically stable to d/c.   Difficult to place patient No   Consultants:  GI Hematology  Procedures:  Antimicrobials: (  Anti-infectives (From admission, onward)    None        Subjective: Patient was seen and examined at bedside.  Overnight events noted.   Patient reports feeling much better, and improved .  She denies any bleeding in the stools or in the urine.  She denies any  abdominal  pain.  Objective: Vitals:   05/06/21 0640 05/06/21 1235 05/06/21 2117 05/07/21 0623  BP: (!) 141/68 (!) 163/77 (!) 150/73 (!) 146/80  Pulse: 64 62 72 66  Resp: 18 16 18 18   Temp: 98.6 F (37 C) 98.5 F (36.9 C) 99.1 F (37.3 C) 97.7 F (36.5 C)  TempSrc: Oral     SpO2: 97% 99% 97% 98%  Weight:      Height:        Intake/Output Summary (Last 24 hours) at 05/07/2021 1107 Last data filed at 05/07/2021 0900  Gross per 24 hour  Intake 1296.46 ml  Output --  Net 1296.46 ml   Filed Weights   05/05/21 1145  Weight: 69.4 kg    Examination:  General exam: Appears calm and comfortable, not in any acute distress. Appears more cheerful. Respiratory system: Clear to auscultation. Respiratory effort normal. Cardiovascular system: S1 & S2 heard, RRR. No JVD, murmurs, rubs, gallops or clicks. No pedal edema. Gastrointestinal system: Abdomen is nondistended, soft and nontender. No organomegaly or masses felt. Normal bowel sounds heard. Central nervous system: Alert and oriented. No focal neurological deficits. Extremities:  No  edema, no cyanosis, no clubbing. Skin: No rashes, lesions or ulcers Psychiatry: Judgement and insight appear normal. Mood & affect appropriate.     Data Reviewed: I have personally reviewed following labs and imaging studies  CBC: Recent Labs  Lab 05/05/21 1316 05/05/21 1653 05/06/21 0454 05/06/21 1054 05/06/21 1700 05/06/21 2231 05/07/21 0549 05/07/21 1007  WBC 5.6  --  5.5  --   --   --  5.7  --   NEUTROABS 3.4  --   --   --   --   --   --   --   HGB 6.7*   < > 9.3* 9.9* 9.9* 9.5* 9.9* 10.0*  HCT 23.7*   < > 30.4* 32.9* 32.2* 31.7* 32.9* 32.0*  MCV 79.5*  --  79.6*  --   --   --  79.7*  --   PLT 389  --  322  --   --   --  359  --    < > = values in this interval not displayed.   Basic Metabolic Panel: Recent Labs  Lab 05/05/21 1316 05/06/21 0454 05/07/21 0549  NA 139 140 138  K 3.5 4.1 3.7  CL 109 110 109  CO2 22 22 22   GLUCOSE 99 95 108*  BUN 14 10 17   CREATININE 0.71 0.62 0.81  CALCIUM 8.6* 8.6* 8.7*   GFR: Estimated Creatinine Clearance: 57.8 mL/min (by C-G formula based on SCr of 0.81 mg/dL). Liver Function Tests: Recent Labs  Lab 05/05/21 1316 05/06/21 0454  AST 20 21  ALT 20 17  ALKPHOS 78 70  BILITOT 0.5 0.8  PROT 7.5 6.8  ALBUMIN 4.4 4.1   No results for input(s): LIPASE, AMYLASE in the last 168 hours. No results for input(s): AMMONIA in the last 168 hours. Coagulation Profile: Recent Labs  Lab 05/05/21 1316  INR 1.0   Cardiac Enzymes: No results for input(s): CKTOTAL, CKMB, CKMBINDEX, TROPONINI in the last 168 hours. BNP (last 3 results) No results for input(s): PROBNP in the last 8760 hours. HbA1C: No results for input(s): HGBA1C in the last 72 hours. CBG: No results for input(s): GLUCAP in the last 168 hours. Lipid Profile: No results for input(s): CHOL, HDL, LDLCALC, TRIG, CHOLHDL, LDLDIRECT in the last 72 hours. Thyroid Function Tests: No results for input(s): TSH, T4TOTAL, FREET4, T3FREE, THYROIDAB in the last 72  hours. Anemia Panel: No results for input(s): VITAMINB12, FOLATE, FERRITIN, TIBC, IRON, RETICCTPCT in the last 72 hours. Sepsis Labs: No results for input(s): PROCALCITON, LATICACIDVEN in the last 168 hours.  Recent Results (from the past 240 hour(s))  Resp Panel by RT-PCR (Flu A&B, Covid) Nasopharyngeal Swab     Status: None   Collection Time: 05/05/21 12:53 PM   Specimen: Nasopharyngeal Swab; Nasopharyngeal(NP) swabs in vial transport medium  Result Value Ref Range Status   SARS Coronavirus 2 by RT PCR NEGATIVE NEGATIVE Final  Comment: (NOTE) SARS-CoV-2 target nucleic acids are NOT DETECTED.  The SARS-CoV-2 RNA is generally detectable in upper respiratory specimens during the acute phase of infection. The lowest concentration of SARS-CoV-2 viral copies this assay can detect is 138 copies/mL. A negative result does not preclude SARS-Cov-2 infection and should not be used as the sole basis for treatment or other patient management decisions. A negative result may occur with  improper specimen collection/handling, submission of specimen other than nasopharyngeal swab, presence of viral mutation(s) within the areas targeted by this assay, and inadequate number of viral copies(<138 copies/mL). A negative result must be combined with clinical observations, patient history, and epidemiological information. The expected result is Negative.  Fact Sheet for Patients:  EntrepreneurPulse.com.au  Fact Sheet for Healthcare Providers:  IncredibleEmployment.be  This test is no t yet approved or cleared by the [place] FDA and  has been authorized for detection and/or diagnosis of SARS-CoV-2 by FDA under an Emergency Use Authorization (EUA). This EUA will remain  in effect (meaning this test can be used) for the duration of the COVID-19 declaration under Section 564(b)(1) of the Act, 21 U.S.C.section 360bbb-3(b)(1), unless the authorization is  terminated  or revoked sooner.       Influenza A by PCR NEGATIVE NEGATIVE Final   Influenza B by PCR NEGATIVE NEGATIVE Final    Comment: (NOTE) The Xpert Xpress SARS-CoV-2/FLU/RSV plus assay is intended as an aid in the diagnosis of influenza from Nasopharyngeal swab specimens and should not be used as a sole basis for treatment. Nasal washings and aspirates are unacceptable for Xpert Xpress SARS-CoV-2/FLU/RSV testing.  Fact Sheet for Patients: EntrepreneurPulse.com.au  Fact Sheet for Healthcare Providers: IncredibleEmployment.be  This test is not yet approved or cleared by the [place] FDA and has been authorized for detection and/or diagnosis of SARS-CoV-2 by FDA under an Emergency Use Authorization (EUA). This EUA will remain in effect (meaning this test can be used) for the duration of the COVID-19 declaration under Section 564(b)(1) of the Act, 21 U.S.C. section 360bbb-3(b)(1), unless the authorization is terminated or revoked.  Performed at [facility], Standard [address] [place], [place] [address]      Radiology Studies: DG Chest 2 View  Result Date: 05/05/2021 CLINICAL DATA:  Shortness of breath EXAM: CHEST - 2 VIEW COMPARISON:  June 13, 2018 FINDINGS: No edema or airspace opacity. Heart size and pulmonary vascularity are normal. No adenopathy. Monitor overlies left chest. There is degenerative change in the thoracic spine. IMPRESSION: Lungs clear.  Cardiac silhouette within normal limits. Electronically Signed   By: Lowella Grip III M.D.   On: 05/05/2021 13:24     Scheduled Meds:  docusate sodium  100 mg Oral BID   levothyroxine  125 mcg Oral Q0600   pantoprazole (PROTONIX) IV  40 mg Intravenous Q12H   polyethylene glycol-electrolytes  4,000 mL Oral Once   Continuous Infusions:  sodium chloride     sodium chloride 50 mL/hr at 05/07/21 0754   [START ON 05/08/2021] desmopressin (DDAVP) IV for Bleeding        LOS: 2 days    Time spent: 25 mins    Tryston Gilliam, MD [facility]   If 7PM-7AM, please contact night-coverage

## 2021-05-07 NOTE — Progress Notes (Signed)
Alexis [name]   DOB:1951/09/07   FB#:[id number]    ASSESSMENT & PLAN:  Iron deficiency anemia, likely due to upper GI bleed until proven otherwise The patient is aware that she should not be taking aspirin or NSAID containing products Her hemoglobin has stabilized after blood transfusion She has received IV iron without difficulties Her hemoglobin is stable  Brief epistaxis She had brief spontaneous nosebleed this morning Hemoglobin stable I do not feel strongly she needs factor VIII replacement  Type I von Willebrand disease The patient is not consistent with follow-up with hematologist She has variable bleeding history in the past She had significant bleeding after thyroid surgery and breast surgery but tolerated colonoscopy x2 well even with polyp removal She had knee surgery in 2019 without posttreatment factor infusion She did well with IV DDAVP prior to procedures According to GI consult service, she will likely have her procedure around Monday afternoon I recommend DDAVP infusion at 0.3 mcg/kg X 1 (approximately 20 mcg) at 6 a.m. Monday morning (order is placed) *Monitor Sodium level Tuesday morning   If she has recurrent active bleeding and hemoglobin drops to less than 8 over the next 24-48 hours, I would recommend Humate P infusion Dose for Humate P: 30 units/kg (approximately 2000 units) every 12 to 24 hours for 1-2 doses   I am not sure the outcome of EGD and colonoscopy If no biopsy or intervention is performed, she can be discharged Monday evening However, if biopsy or intervention is performed, I recommend her to stay overnight to get her blood counts checked Tuesday morning  All questions were answered. The patient knows to call the clinic with any problems, questions or concerns.   The total time spent in the appointment was 15 minutes encounter with patients including review of chart and various tests results, discussions about plan of care and coordination of care  plan  Heath Lark, MD 05/07/2021 9:28 AM  Subjective:  She tolerated IV iron well.  No melena or hematochezia.  She had brief episode of nosebleed this morning but that has resolved spontaneously  Objective:  Vitals:   05/06/21 2117 05/07/21 0623  BP: (!) 150/73 (!) 146/80  Pulse: 72 66  Resp: 18 18  Temp: 99.1 F (37.3 C) 97.7 F (36.5 C)  SpO2: 97% 98%     Intake/Output Summary (Last 24 hours) at 05/07/2021 7824 Last data filed at 05/07/2021 0754 Gross per 24 hour  Intake 1056.46 ml  Output --  Net 1056.46 ml    GENERAL:alert, no distress and comfortable NEURO: alert & oriented x 3 with fluent speech, no focal motor/sensory deficits   Labs:  Recent Labs    05/05/21 1316 05/06/21 0454 05/07/21 0549  NA 139 140 138  K 3.5 4.1 3.7  CL 109 110 109  CO2 22 22 22   GLUCOSE 99 95 108*  BUN 14 10 17   CREATININE 0.71 0.62 0.81  CALCIUM 8.6* 8.6* 8.7*  GFRNONAA >60 >60 >60  PROT 7.5 6.8  --   ALBUMIN 4.4 4.1  --   AST 20 21  --   ALT 20 17  --   ALKPHOS 78 70  --   BILITOT 0.5 0.8  --     Studies:  DG Chest 2 View  Result Date: 05/05/2021 CLINICAL DATA:  Shortness of breath EXAM: CHEST - 2 VIEW COMPARISON:  June 13, 2018 FINDINGS: No edema or airspace opacity. Heart size and pulmonary vascularity are normal. No adenopathy. Monitor overlies left chest.  There is degenerative change in the thoracic spine. IMPRESSION: Lungs clear.  Cardiac silhouette within normal limits. Electronically Signed   By: Lowella Grip III M.D.   On: 05/05/2021 13:24

## 2021-05-07 NOTE — H&P (View-Only) (Signed)
Hemoglobin stable, as expected.  Patient denies use of aspirin or nonsteroidals prior to admission, other than on very rare occasions.  For prep today, and endoscopy and colonoscopy at noon tomorrow by Dr. Therisa Doyne.  Dr. Alvy Bimler has ordered DDAVP for tomorrow morning in view of history of von Willebrand's disease.  Cleotis Nipper, M.D. Pager [phone] If no answer or after 5 PM call [phone]

## 2021-05-07 NOTE — Progress Notes (Addendum)
Hemoglobin stable, as expected.  Patient denies use of aspirin or nonsteroidals prior to admission, other than on very rare occasions.  For prep today, and endoscopy and colonoscopy at noon tomorrow by Dr. Therisa Doyne.  Dr. Alvy Bimler has ordered DDAVP for tomorrow morning in view of history of von Willebrand's disease.  Cleotis Nipper, M.D. Pager [phone] If no answer or after 5 PM call [phone]

## 2021-05-08 ENCOUNTER — Encounter (HOSPITAL_COMMUNITY)
Admission: EM | Disposition: A | Discharge status: Home / Self Care | Payer: Self-pay | Source: Home / Self Care | Attending: Family Medicine

## 2021-05-08 ENCOUNTER — Encounter (HOSPITAL_COMMUNITY): Payer: Self-pay | Admitting: Family Medicine

## 2021-05-08 ENCOUNTER — Other Ambulatory Visit: Payer: Self-pay | Admitting: Hematology and Oncology

## 2021-05-08 ENCOUNTER — Inpatient Hospital Stay (HOSPITAL_COMMUNITY): Payer: PPO | Admitting: Registered Nurse

## 2021-05-08 HISTORY — PX: BIOPSY: SHX5522

## 2021-05-08 HISTORY — PX: COLONOSCOPY WITH PROPOFOL: SHX5780

## 2021-05-08 HISTORY — PX: ESOPHAGOGASTRODUODENOSCOPY (EGD) WITH PROPOFOL: SHX5813

## 2021-05-08 LAB — HEMOGLOBIN AND HEMATOCRIT, BLOOD
HCT: 35.4 % — ABNORMAL LOW (ref 36.0–46.0)
HCT: 35.8 % — ABNORMAL LOW (ref 36.0–46.0)
HCT: 37.9 % (ref 36.0–46.0)
HCT: 36.9 % (ref 36.0–46.0)
HCT: 34.4 % — ABNORMAL LOW (ref 36.0–46.0)
Hemoglobin: 10.6 g/dL — ABNORMAL LOW (ref 12.0–15.0)
Hemoglobin: 10.7 g/dL — ABNORMAL LOW (ref 12.0–15.0)
Hemoglobin: 11.2 g/dL — ABNORMAL LOW (ref 12.0–15.0)
Hemoglobin: 11 g/dL — ABNORMAL LOW (ref 12.0–15.0)
Hemoglobin: 10.4 g/dL — ABNORMAL LOW (ref 12.0–15.0)

## 2021-05-08 SURGERY — ESOPHAGOGASTRODUODENOSCOPY (EGD) WITH PROPOFOL
Anesthesia: Monitor Anesthesia Care

## 2021-05-08 MED ORDER — LIDOCAINE HCL (CARDIAC) PF 100 MG/5ML IV SOSY
PREFILLED_SYRINGE | INTRAVENOUS | Status: DC | PRN
  Administered 2021-05-08: 50 mg via INTRAVENOUS

## 2021-05-08 MED ORDER — PROPOFOL 1000 MG/100ML IV EMUL
INTRAVENOUS | Status: AC
  Filled 2021-05-08: qty 100

## 2021-05-08 MED ORDER — LACTATED RINGERS IV SOLN
INTRAVENOUS | Status: AC | PRN
  Administered 2021-05-08: 20 mL/h via INTRAVENOUS

## 2021-05-08 MED ORDER — SODIUM CHLORIDE 0.9 % IV SOLN: INTRAVENOUS | Status: DC

## 2021-05-08 MED ORDER — PROPOFOL 500 MG/50ML IV EMUL
INTRAVENOUS | Status: DC | PRN
  Administered 2021-05-08: 300 ug/kg/min via INTRAVENOUS

## 2021-05-08 MED ORDER — PANTOPRAZOLE SODIUM 40 MG IV SOLR
40.0000 mg | INTRAVENOUS | Status: DC
  Administered 2021-05-09: 40 mg via INTRAVENOUS
  Filled 2021-05-08: qty 40

## 2021-05-08 SURGICAL SUPPLY — 25 items

## 2021-05-08 NOTE — Progress Notes (Signed)
PROGRESS NOTE    Alexis [name]  EQA:[id number] DOB: Apr 14, 1951 DOA: 05/05/2021 PCP: Merrilee Seashore, MD    Brief Narrative:  This 70 years old female with PMH significant for breast cancer, thyroid cancer which was cured, hypothyroidism, Von Willbrand disease presented in the ED with complaints of fatigue, tiredness ,intermittent palpitation and shortness of breath on exertion. She denies any obvious or visible bleeding in the stools, urine, through nose or mouth.  She went to see her primary care physician who has ordered some blood work came out  Her Hb 6.8, She denies using any NSAIDs or chronic pain medications.  Patient has received 2 units of packed red blood cells,  her hemoglobin has improved to 9.9.  She is hemodynamically stable. GI is consulted recommended EGD and colonoscopy on Monday 6/13.  Patient was also seen by hematologist, recommended DDVAP infusion before EGD.  Assessment & Plan:   Active Problems:   Acute GI bleeding  Acute on chronic blood loss anemia secondary to GI bleeding. Patient presented with progressive fatigue and shortness of breath on exertion. She is found to have Hb of 6.7 and was sent from PCPs office. Stool for occult blood is positive in the ER. She denies any obvious/ visible bleeding. She is hemodynamically stable. Last known Hb 12.5 gm  1-year ago. Continue Protonix 40 IV every 12 hours. Monitor H &H every 6 hours. S/p 2 PRBC  , Hb  9.9 > 10.2 as expected. GI consulted, Dr. Cristina Gong has recommended EGD and colonoscopy on Monday by Dr. Therisa Doyne She is scheduled to have EGD/ colonoscopy today. Patient denies any EGD in the past.    Hypothyroidism : continue levothyroxine.  History of von Willebrand disease; Patient does have a history of bleeding during thyroid and breast surgery. Hematology consulted,  recommended DDAVP infusion before EGD. If no biopsy or intervention is performed, she can be discharged today evening. However, if biopsy  or intervention is performed, Heme recommend to stay overnight to get her blood counts checked Tuesday morning.   DVT prophylaxis: SCDs Code Status: Full code. Family Communication:  No family at bed side. Disposition Plan:   Status is: Inpatient  Remains inpatient appropriate because:Inpatient level of care appropriate due to severity of illness  Dispo: The patient is from: Home              Anticipated d/c is to: Home in 1-2 days              Patient currently is not medically stable to d/c.   Difficult to place patient No   Consultants:  GI Hematology  Procedures:  Antimicrobials: (  Anti-infectives (From admission, onward)    None        Subjective: Patient was seen and examined at bedside.  Overnight events noted.   Patient reports feeling much better, and improved .  She denies any bleeding in the stools or in the urine.   She is having EGD and colonoscopy today afternoon.  Objective: Vitals:   05/07/21 1209 05/07/21 2041 05/07/21 2203 05/08/21 0558  BP: (!) 152/76 (!) 178/77 (!) 165/79 125/71  Pulse: 66 62  65  Resp: 18 18  16   Temp: 98.1 F (36.7 C) 97.9 F (36.6 C)  98.2 F (36.8 C)  TempSrc: Oral Oral  Oral  SpO2: 97% 100%  96%  Weight:      Height:        Intake/Output Summary (Last 24 hours) at 05/08/2021 1050 Last data filed  at 05/08/2021 0600 Gross per 24 hour  Intake 454.01 ml  Output --  Net 454.01 ml    Filed Weights   05/05/21 1145  Weight: 69.4 kg    Examination:  General exam: Appears calm and comfortable, not in any acute distress. Appears more cheerful. Respiratory system: Clear to auscultation. Respiratory effort normal. Cardiovascular system: S1 & S2 heard, RRR. No JVD, murmurs, rubs, gallops or clicks. No pedal edema. Gastrointestinal system: Abdomen is nondistended, soft and nontender. No organomegaly or masses felt. Normal bowel sounds heard. Central nervous system: Alert and oriented. No focal neurological  deficits. Extremities:  No edema, no cyanosis, no clubbing. Skin: No rashes, lesions or ulcers Psychiatry: Judgement and insight appear normal. Mood & affect appropriate.     Data Reviewed: I have personally reviewed following labs and imaging studies  CBC: Recent Labs  Lab 05/05/21 1316 05/05/21 1653 05/06/21 0454 05/06/21 1054 05/07/21 0549 05/07/21 1007 05/07/21 1658 05/07/21 2359 05/08/21 0452  WBC 5.6  --  5.5  --  5.7  --   --   --   --   NEUTROABS 3.4  --   --   --   --   --   --   --   --   HGB 6.7*   < > 9.3*   < > 9.9* 10.0* 10.7* 10.6* 10.7*  HCT 23.7*   < > 30.4*   < > 32.9* 32.0* 36.1 35.4* 35.8*  MCV 79.5*  --  79.6*  --  79.7*  --   --   --   --   PLT 389  --  322  --  359  --   --   --   --    < > = values in this interval not displayed.    Basic Metabolic Panel: Recent Labs  Lab 05/05/21 1316 05/06/21 0454 05/07/21 0549  NA 139 140 138  K 3.5 4.1 3.7  CL 109 110 109  CO2 22 22 22   GLUCOSE 99 95 108*  BUN 14 10 17   CREATININE 0.71 0.62 0.81  CALCIUM 8.6* 8.6* 8.7*    GFR: Estimated Creatinine Clearance: 57.8 mL/min (by C-G formula based on SCr of 0.81 mg/dL). Liver Function Tests: Recent Labs  Lab 05/05/21 1316 05/06/21 0454  AST 20 21  ALT 20 17  ALKPHOS 78 70  BILITOT 0.5 0.8  PROT 7.5 6.8  ALBUMIN 4.4 4.1    No results for input(s): LIPASE, AMYLASE in the last 168 hours. No results for input(s): AMMONIA in the last 168 hours. Coagulation Profile: Recent Labs  Lab 05/05/21 1316  INR 1.0    Cardiac Enzymes: No results for input(s): CKTOTAL, CKMB, CKMBINDEX, TROPONINI in the last 168 hours. BNP (last 3 results) No results for input(s): PROBNP in the last 8760 hours. HbA1C: No results for input(s): HGBA1C in the last 72 hours. CBG: No results for input(s): GLUCAP in the last 168 hours. Lipid Profile: No results for input(s): CHOL, HDL, LDLCALC, TRIG, CHOLHDL, LDLDIRECT in the last 72 hours. Thyroid Function Tests: No  results for input(s): TSH, T4TOTAL, FREET4, T3FREE, THYROIDAB in the last 72 hours. Anemia Panel: No results for input(s): VITAMINB12, FOLATE, FERRITIN, TIBC, IRON, RETICCTPCT in the last 72 hours. Sepsis Labs: No results for input(s): PROCALCITON, LATICACIDVEN in the last 168 hours.  Recent Results (from the past 240 hour(s))  Resp Panel by RT-PCR (Flu A&B, Covid) Nasopharyngeal Swab     Status: None   Collection Time: 05/05/21 12:53  PM   Specimen: Nasopharyngeal Swab; Nasopharyngeal(NP) swabs in vial transport medium  Result Value Ref Range Status   SARS Coronavirus 2 by RT PCR NEGATIVE NEGATIVE Final    Comment: (NOTE) SARS-CoV-2 target nucleic acids are NOT DETECTED.  The SARS-CoV-2 RNA is generally detectable in upper respiratory specimens during the acute phase of infection. The lowest concentration of SARS-CoV-2 viral copies this assay can detect is 138 copies/mL. A negative result does not preclude SARS-Cov-2 infection and should not be used as the sole basis for treatment or other patient management decisions. A negative result may occur with  improper specimen collection/handling, submission of specimen other than nasopharyngeal swab, presence of viral mutation(s) within the areas targeted by this assay, and inadequate number of viral copies(<138 copies/mL). A negative result must be combined with clinical observations, patient history, and epidemiological information. The expected result is Negative.  Fact Sheet for Patients:  EntrepreneurPulse.com.au  Fact Sheet for Healthcare Providers:  IncredibleEmployment.be  This test is no t yet approved or cleared by the [place] FDA and  has been authorized for detection and/or diagnosis of SARS-CoV-2 by FDA under an Emergency Use Authorization (EUA). This EUA will remain  in effect (meaning this test can be used) for the duration of the COVID-19 declaration under Section 564(b)(1) of  the Act, 21 U.S.C.section 360bbb-3(b)(1), unless the authorization is terminated  or revoked sooner.       Influenza A by PCR NEGATIVE NEGATIVE Final   Influenza B by PCR NEGATIVE NEGATIVE Final    Comment: (NOTE) The Xpert Xpress SARS-CoV-2/FLU/RSV plus assay is intended as an aid in the diagnosis of influenza from Nasopharyngeal swab specimens and should not be used as a sole basis for treatment. Nasal washings and aspirates are unacceptable for Xpert Xpress SARS-CoV-2/FLU/RSV testing.  Fact Sheet for Patients: EntrepreneurPulse.com.au  Fact Sheet for Healthcare Providers: IncredibleEmployment.be  This test is not yet approved or cleared by the [place] FDA and has been authorized for detection and/or diagnosis of SARS-CoV-2 by FDA under an Emergency Use Authorization (EUA). This EUA will remain in effect (meaning this test can be used) for the duration of the COVID-19 declaration under Section 564(b)(1) of the Act, 21 U.S.C. section 360bbb-3(b)(1), unless the authorization is terminated or revoked.  Performed at [facility], [place] [address] [place], [place] [address]       Radiology Studies: No results found.   Scheduled Meds:  docusate sodium  100 mg Oral BID   levothyroxine  125 mcg Oral Q0600   pantoprazole (PROTONIX) IV  40 mg Intravenous Q12H   Continuous Infusions:  sodium chloride       LOS: 3 days    Time spent: 25 mins    Lyanna Blystone, MD [facility]   If 7PM-7AM, please contact night-coverage

## 2021-05-08 NOTE — Op Note (Signed)
[facility] Patient Name: Alexis [name] Procedure Date: 05/08/2021 MRN: [id number] Attending MD: Ronnette Juniper , MD Date of Birth: 18-Oct-1951 CSN: [id number] Age: 70 Admit Type: Outpatient Procedure:                Colonoscopy Indications:              Last colonoscopy: 2011, Heme positive stool,                            Unexplained iron deficiency anemia Providers:                Ronnette Juniper, MD, Mariana Arn, Cletis Athens,                            Technician, Enrigue Catena, CRNA Referring MD:             [facility], PCP- Emilee Hero, MD Medicines:                Monitored Anesthesia Care Complications:            No immediate complications. Estimated Blood Loss:     Estimated blood loss: none. Procedure:                Pre-Anesthesia Assessment:                           - Prior to the procedure, a History and Physical                            was performed, and patient medications and                            allergies were reviewed. The patient's tolerance of                            previous anesthesia was also reviewed. The risks                            and benefits of the procedure and the sedation                            options and risks were discussed with the patient.                            All questions were answered, and informed consent                            was obtained. Prior Anticoagulants: The patient has                            taken no previous anticoagulant or antiplatelet                            agents. ASA Grade Assessment: III - A patient with  severe systemic disease. After reviewing the risks                            and benefits, the patient was deemed in                            satisfactory condition to undergo the procedure.                           - Prior to the procedure, a History and Physical                            was performed, and patient medications and                             allergies were reviewed. The patient's tolerance of                            previous anesthesia was also reviewed. The risks                            and benefits of the procedure and the sedation                            options and risks were discussed with the patient.                            All questions were answered, and informed consent                            was obtained. Prior Anticoagulants: The patient has                            taken no previous anticoagulant or antiplatelet                            agents. ASA Grade Assessment: III - A patient with                            severe systemic disease. After reviewing the risks                            and benefits, the patient was deemed in                            satisfactory condition to undergo the procedure.                           After obtaining informed consent, the colonoscope                            was passed under direct vision. Throughout the  procedure, the patient's blood pressure, pulse, and                            oxygen saturations were monitored continuously. The                            [id number]-H190DL ([id number]) Olympus pediatric colonscope                            was introduced through the anus and advanced to the                            the terminal ileum. The colonoscopy was performed                            without difficulty. The patient tolerated the                            procedure well. The quality of the bowel                            preparation was good. Scope In: 12:50:46 PM Scope Out: 1:01:10 PM Scope Withdrawal Time: 0 hours 7 minutes 6 seconds  Total Procedure Duration: 0 hours 10 minutes 24 seconds  Findings:      The perianal and digital rectal examinations were normal.      Multiple small and large-mouthed diverticula were found in the sigmoid       colon and descending colon.      The terminal  ileum appeared normal.      The retroflexed view of the distal rectum and anal verge was normal and       showed no anal or rectal abnormalities. Impression:               - Diverticulosis in the sigmoid colon and in the                            descending colon.                           - The examined portion of the ileum was normal.                           - The distal rectum and anal verge are normal on                            retroflexion view.                           - No specimens collected. Moderate Sedation:      Patient did not receive moderate sedation for this procedure, but       instead received monitored anesthesia care. Recommendation:           - Patient has a contact number available for  emergencies. The signs and symptoms of potential                            delayed complications were discussed with the                            patient. Return to normal activities tomorrow.                            Written discharge instructions were provided to the                            patient.                           - High fiber diet.                           - Continue present medications.                           - Repeat colonoscopy in 10 years for screening                            purposes. Procedure Code(s):        --- Professional ---                           [phone], Colonoscopy, flexible; diagnostic, including                            collection of specimen(s) by brushing or washing,                            when performed (separate procedure) Diagnosis Code(s):        --- Professional ---                           R19.5, Other fecal abnormalities                           D50.9, Iron deficiency anemia, unspecified                           K57.30, Diverticulosis of large intestine without                            perforation or abscess without bleeding CPT copyright 2019 American Medical Association. All rights  reserved. The codes documented in this report are preliminary and upon coder review may  be revised to meet current compliance requirements. Ronnette Juniper, MD 05/08/2021 1:10:09 PM This report has been signed electronically. Number of Addenda: 0

## 2021-05-08 NOTE — Op Note (Signed)
[facility] Patient Name: Alexis [name] Procedure Date: 05/08/2021 MRN: [id number] Attending MD: Ronnette Juniper , MD Date of Birth: [birth year] CSN: [id number] Age: 70 Admit Type: Outpatient Procedure:                Upper GI endoscopy Indications:              Unexplained iron deficiency anemia, Heme positive                            stool Providers:                Ronnette Juniper, MD, Mariana Arn, Cletis Athens,                            Technician, Enrigue Catena, CRNA Referring MD:             [facility], PCP- Emilee Hero, MD Medicines:                Monitored Anesthesia Care Complications:            No immediate complications. Estimated blood loss:                            Minimal. Estimated Blood Loss:     Estimated blood loss was minimal. Procedure:                Pre-Anesthesia Assessment:                           - Prior to the procedure, a History and Physical                            was performed, and patient medications and                            allergies were reviewed. The patient's tolerance of                            previous anesthesia was also reviewed. The risks                            and benefits of the procedure and the sedation                            options and risks were discussed with the patient.                            All questions were answered, and informed consent                            was obtained. Prior Anticoagulants: The patient has                            taken no previous anticoagulant or antiplatelet  agents. ASA Grade Assessment: III - A patient with                            severe systemic disease. After reviewing the risks                            and benefits, the patient was deemed in                            satisfactory condition to undergo the procedure.                           After obtaining informed consent, the endoscope was                             passed under direct vision. Throughout the                            procedure, the patient's blood pressure, pulse, and                            oxygen saturations were monitored continuously. The                            [id number]-[id number] ([id number]) Olympus gastroscope was                            introduced through the mouth, and advanced to the                            second part of duodenum. The upper GI endoscopy was                            accomplished without difficulty. The patient                            tolerated the procedure well. Scope In: Scope Out: Findings:      The examined esophagus was normal.      The Z-line was regular and was found 35 cm from the incisors.      One non-bleeding superficial gastric ulcer was found in the gastric       antrum. The lesion was 5 mm in largest dimension. Biopsies were taken       with a cold forceps for Helicobacter pylori testing.      The cardia and gastric fundus were normal on retroflexion.      The examined duodenum was normal. Impression:               - Normal esophagus.                           - Z-line regular, 35 cm from the incisors.                           - Non-bleeding gastric ulcer. Biopsied.                           -  Normal examined duodenum. Moderate Sedation:      Patient did not receive moderate sedation for this procedure, but       instead received monitored anesthesia care. Recommendation:           - Patient has a contact number available for                            emergencies. The signs and symptoms of potential                            delayed complications were discussed with the                            patient. Return to normal activities tomorrow.                            Written discharge instructions were provided to the                            patient.                           - Resume regular diet.                           - Continue present medications, PPI once a day for                             2 months.                           - Await pathology results.                           - No aspirin, ibuprofen, naproxen, or other                            non-steroidal anti-inflammatory drugs. Procedure Code(s):        --- Professional ---                           [phone], Esophagogastroduodenoscopy, flexible,                            transoral; with biopsy, single or multiple Diagnosis Code(s):        --- Professional ---                           K25.9, Gastric ulcer, unspecified as acute or                            chronic, without hemorrhage or perforation                           D50.9, Iron deficiency anemia, unspecified  R19.5, Other fecal abnormalities CPT copyright 2019 American Medical Association. All rights reserved. The codes documented in this report are preliminary and upon coder review may  be revised to meet current compliance requirements. Ronnette Juniper, MD 05/08/2021 1:07:45 PM This report has been signed electronically. Number of Addenda: 0

## 2021-05-08 NOTE — Anesthesia Preprocedure Evaluation (Signed)
Anesthesia Evaluation  Patient identified by MRN, date of birth, ID band Patient awake    Reviewed: Allergy & Precautions, NPO status , Patient's Chart, lab work & pertinent test results  Airway Mallampati: I       Dental no notable dental hx.    Pulmonary former smoker,    Pulmonary exam normal        Cardiovascular negative cardio ROS Normal cardiovascular exam     Neuro/Psych negative neurological ROS     GI/Hepatic negative GI ROS, Neg liver ROS,   Endo/Other    Renal/GU negative Renal ROS  negative genitourinary   Musculoskeletal   Abdominal Normal abdominal exam  (+)   Peds  Hematology  (+) anemia ,   Anesthesia Other Findings   Reproductive/Obstetrics                             Anesthesia Physical Anesthesia Plan  ASA: 2  Anesthesia Plan: MAC   Post-op Pain Management:    Induction:   PONV Risk Score and Plan: 2 and Propofol infusion and TIVA  Airway Management Planned: Natural Airway and Mask  Additional Equipment: None  Intra-op Plan:   Post-operative Plan:   Informed Consent: I have reviewed the patients History and Physical, chart, labs and discussed the procedure including the risks, benefits and alternatives for the proposed anesthesia with the patient or authorized representative who has indicated his/her understanding and acceptance.     Dental advisory given  Plan Discussed with: CRNA  Anesthesia Plan Comments:         Anesthesia Quick Evaluation

## 2021-05-08 NOTE — Anesthesia Procedure Notes (Signed)
Procedure Name: MAC Date/Time: 05/08/2021 12:36 PM Performed by: Lissa Morales, CRNA Pre-anesthesia Checklist: Patient identified, Emergency Drugs available, Suction available, Patient being monitored and Timeout performed Patient Re-evaluated:Patient Re-evaluated prior to induction Oxygen Delivery Method: Simple face mask Placement Confirmation: positive ETCO2

## 2021-05-08 NOTE — Interval H&P Note (Signed)
History and Physical Interval Note: 69/female with anemia, FOBT positive stool, remote history of colonic adenoma removal for an EGD and colonoscopy.  05/08/2021 12:00 PM  Alexis [name]  has presented today for EGD and colonoscopy, with the diagnosis of Heme positive stool and anemia.  The various methods of treatment have been discussed with the patient and family. After consideration of risks, benefits and other options for treatment, the patient has consented to  Procedure(s) with comments: ESOPHAGOGASTRODUODENOSCOPY (EGD) WITH PROPOFOL (N/A) - might need postprocedural DDAVP; Hematology Consultant to arrange COLONOSCOPY WITH PROPOFOL (N/A) - Might need postprocedural DDAVP; hematology consultant will arrange as a surgical intervention.  The patient's history has been reviewed, patient examined, no change in status, stable for surgery.  I have reviewed the patient's chart and labs.  Questions were answered to the patient's satisfaction.     Alexis [name]

## 2021-05-08 NOTE — Transfer of Care (Signed)
Immediate Anesthesia Transfer of Care Note  Patient: DAMIAN HOFSTRA  Procedure(s) Performed: ESOPHAGOGASTRODUODENOSCOPY (EGD) WITH PROPOFOL COLONOSCOPY WITH PROPOFOL BIOPSY  Patient Location: PACU  Anesthesia Type:MAC  Level of Consciousness: awake, alert , oriented and patient cooperative  Airway & Oxygen Therapy: Patient Spontanous Breathing and Patient connected to face mask oxygen  Post-op Assessment: Report given to RN, Post -op Vital signs reviewed and stable and Patient moving all extremities X 4  Post vital signs: stable  Last Vitals:  Vitals Value Taken Time  BP    Temp    Pulse 70 05/08/21 1312  Resp 14 05/08/21 1312  SpO2 92 % 05/08/21 1312  Vitals shown include unvalidated device data.  Last Pain:  Vitals:   05/08/21 1105  TempSrc: Oral  PainSc: 1       Patients Stated Pain Goal: 0 (03/09/63 3837)  Complications: No notable events documented.

## 2021-05-08 NOTE — Anesthesia Postprocedure Evaluation (Signed)
Anesthesia Post Note  Patient: Alexis [name]  Procedure(s) Performed: ESOPHAGOGASTRODUODENOSCOPY (EGD) WITH PROPOFOL COLONOSCOPY WITH PROPOFOL BIOPSY     Patient location during evaluation: Endoscopy Anesthesia Type: MAC Level of consciousness: awake and sedated Pain management: pain level controlled Vital Signs Assessment: post-procedure vital signs reviewed and stable Respiratory status: spontaneous breathing Cardiovascular status: stable Postop Assessment: no apparent nausea or vomiting Anesthetic complications: no   No notable events documented.  Last Vitals:  Vitals:   05/08/21 1320 05/08/21 1330  BP: (!) 116/59 (!) 130/56  Pulse: 65 69  Resp: 19 14  Temp:    SpO2: 98% 99%    Last Pain:  Vitals:   05/08/21 1330  TempSrc:   PainSc: 0-No pain   Pain Goal: Patients Stated Pain Goal: 0 (05/06/21 1003)                 Huston Foley

## 2021-05-09 DIAGNOSIS — D649 Anemia, unspecified: Secondary | ICD-10-CM

## 2021-05-09 LAB — BASIC METABOLIC PANEL
Anion gap: 8 (ref 5–15)
BUN: 18 mg/dL (ref 8–23)
CO2: 22 mmol/L (ref 22–32)
Calcium: 9.4 mg/dL (ref 8.9–10.3)
Chloride: 106 mmol/L (ref 98–111)
Creatinine, Ser: 0.74 mg/dL (ref 0.44–1.00)
GFR, Estimated: >60 mL/min (ref >60)
Glucose, Bld: 111 mg/dL — ABNORMAL HIGH (ref 70–99)
Potassium: 3.7 mmol/L (ref 3.5–5.1)
Sodium: 136 mmol/L (ref 135–145)

## 2021-05-09 LAB — HEMOGLOBIN AND HEMATOCRIT, BLOOD
HCT: 35.3 % — ABNORMAL LOW (ref 36.0–46.0)
Hemoglobin: 10.7 g/dL — ABNORMAL LOW (ref 12.0–15.0)

## 2021-05-09 LAB — SURGICAL PATHOLOGY

## 2021-05-09 MED ORDER — PANTOPRAZOLE SODIUM 40 MG PO TBEC: 40.0000 mg | DELAYED_RELEASE_TABLET | Freq: Every day | ORAL | 1 refills | Status: DC

## 2021-05-09 NOTE — Progress Notes (Signed)
Patient discharged home with husband, discharge instructions given and explained to patient, she verbalized understanding, denies any pain/distress, No wound or pressure injury noted. Accompanied home by husband.

## 2021-05-09 NOTE — Progress Notes (Signed)
[facility] Gastroenterology Progress Note  Alexis [name] 70 y.o. 01-26-1951  CC:  Heme-positive anemia  Subjective: Patient reports feeling well today. She is eating french toast. Denies abdominal pain, nausea, vomiting.  She had a loose yellowish BM today.  ROS : Review of Systems  Cardiovascular:  Negative for chest pain and palpitations.  Gastrointestinal:  Negative for abdominal pain, blood in stool, constipation, diarrhea, heartburn, melena, nausea and vomiting.    Objective: Vital signs in last 24 hours: Vitals:   05/08/21 2022 05/09/21 0541  BP: (!) 171/78 131/69  Pulse: 72 61  Resp: 16 16  Temp: 98.4 F (36.9 C) 97.7 F (36.5 C)  SpO2: 99% 97%    Physical Exam:  General:  Alert, oriented, cooperative, no distress  Head:  Normocephalic, without obvious abnormality, atraumatic  Eyes:  Anicteric sclera, EOMs intact  Lungs:   Clear to auscultation bilaterally, respirations unlabored  Heart:  Regular rate and rhythm, S1, S2 normal  Abdomen:   Soft, non-tender, non-distended, bowel sounds active all four quadrants  Extremities: Extremities normal, atraumatic, no  edema    Lab Results: Recent Labs    05/07/21 0549 05/09/21 0457  NA 138 136  K 3.7 3.7  CL 109 106  CO2 22 22  GLUCOSE 108* 111*  BUN 17 18  CREATININE 0.81 0.74  CALCIUM 8.7* 9.4   No results for input(s): AST, ALT, ALKPHOS, BILITOT, PROT, ALBUMIN in the last 72 hours. Recent Labs    05/07/21 0549 05/07/21 1007 05/08/21 2157 05/09/21 0457  WBC 5.7  --   --   --   HGB 9.9*   < > 10.4* 10.7*  HCT 32.9*   < > 34.4* 35.3*  MCV 79.7*  --   --   --   PLT 359  --   --   --    < > = values in this interval not displayed.   No results for input(s): LABPROT, INR in the last 72 hours.   Assessment: Heme-positive anemia: EGD revealed non-bleeding gastric ulcer, biopsies pending.  Colonoscopy revealed diverticulosis. -Hgb 10.7, stable -Normal renal function: BUN 18/ Cr 0.74  Plan: OK to  discharge from a GI standpoint. FU in GI office in 2 months. Await pathology.  Continue Protonix 40 mg once daily for 2 months.  [facility] will sign off. Please contact us if we can be of any further assistance during this hospital stay.  Salley Slaughter PA-C 05/09/2021, 8:32 AM  Contact #  [phone]

## 2021-05-09 NOTE — Discharge Summary (Signed)
Physician Discharge Summary  Alexis [name] XAJ:[id number] DOB: 05/11/1951 DOA: 05/05/2021  PCP: Merrilee Seashore, MD  Admit date: 05/05/2021 Discharge date: 05/09/2021  Admitted From: Home Disposition:  Home   Recommendations for Outpatient Follow-up:  Follow up with PCP in 1-2 weeks Please obtain BMP/CBC in one week Biopsy results pending.   Home Health: None  Discharge Condition: Stable.  CODE STATUS:Full code.  Diet recommendation: Heart Healthy   Brief/Interim Summary: This 70 years old female with PMH significant for breast cancer, thyroid cancer which was cured, hypothyroidism, Von Willbrand disease presented in the ED with complaints of fatigue, tiredness ,intermittent palpitation and shortness of breath on exertion. She denies any obvious or visible bleeding in the stools, urine, through nose or mouth.  She went to see her primary care physician who has ordered some blood work came out  Her Hb 6.8, She denies using any NSAIDs or chronic pain medications.  Patient has received 2 units of packed red blood cells,  her hemoglobin has improved to 9.9.  She is hemodynamically stable. GI is consulted recommended EGD and colonoscopy on Monday 6/13.  Patient was also seen by hematologist, recommended DDVAP infusion before EGD.   Acute on chronic blood loss anemia secondary to GI bleeding. Patient presented with progressive fatigue and shortness of breath on exertion.  She is found to have Hb of 6.7 and was sent from PCPs office. Stool for occult blood is positive in the ER. She denies any obvious/ visible bleeding. She is hemodynamically stable. Last known Hb 12.5 gm  1-year ago. Treated with  Protonix 40 IV every 12 hours. S/p 2 PRBC  , Hb  9.9 > 10.2 as expected. Underwent endoscopy /colonoscopy 05/08/2021; one non bleeding gastric ulcer, biopsy taken.  Diverticuloses sigmoid and colon.  Plan to discharge on PPI for 2 months.  Biopsy pending.       Hypothyroidism : continue  levothyroxine.   History of von Willebrand disease; Patient does have a history of bleeding during thyroid and breast surgery. Hematology consulted,  recommended DDAVP infusion before EGD. Patient was observed overnight after biopsy/endoscopy.  Hb remain stable.     Discharge Diagnoses:  Active Problems:   Acute GI bleeding    Discharge Instructions  Discharge Instructions     Diet - low sodium heart healthy   Complete by: As directed    Increase activity slowly   Complete by: As directed       Allergies as of 05/09/2021   No Known Allergies      Medication List     TAKE these medications    levothyroxine 125 MCG tablet Commonly known as: SYNTHROID Take 125 mcg by mouth daily before breakfast.   pantoprazole 40 MG tablet Commonly known as: Protonix Take 1 tablet (40 mg total) by mouth daily.        Follow-up Information     Wilford Corner, MD. Schedule an appointment as soon as possible for a visit.   Specialty: Gastroenterology Contact information: [address] [place] [address] [phone]         Merrilee Seashore, MD. Call in 1 week(s).   Specialty: Internal Medicine Contact information: [place] [address] [phone]                No Known Allergies  Consultations: GI   Procedures/Studies: DG Chest 2 View  Result Date: 05/05/2021 CLINICAL DATA:  Shortness of breath EXAM: CHEST - 2 VIEW COMPARISON:  June 13, 2018 FINDINGS: No edema or airspace opacity. Heart size and pulmonary vascularity are normal. No adenopathy. Monitor overlies left chest. There is degenerative change in the thoracic spine. IMPRESSION: Lungs clear.  Cardiac silhouette within normal limits. Electronically Signed   By: Lowella Grip III M.D.   On: 05/05/2021 13:24     Subjective: She is feeling better. No blood in the stool. Tolerating diet.   Discharge Exam: Vitals:   05/08/21 2022  05/09/21 0541  BP: (!) 171/78 131/69  Pulse: 72 61  Resp: 16 16  Temp: 98.4 F (36.9 C) 97.7 F (36.5 C)  SpO2: 99% 97%     General: Pt is alert, awake, not in acute distress Cardiovascular: RRR, S1/S2 +, no rubs, no gallops Respiratory: CTA bilaterally, no wheezing, no rhonchi Abdominal: Soft, NT, ND, bowel sounds + Extremities: no edema, no cyanosis    The results of significant diagnostics from this hospitalization (including imaging, microbiology, ancillary and laboratory) are listed below for reference.     Microbiology: Recent Results (from the past 240 hour(s))  Resp Panel by RT-PCR (Flu A&B, Covid) Nasopharyngeal Swab     Status: None   Collection Time: 05/05/21 12:53 PM   Specimen: Nasopharyngeal Swab; Nasopharyngeal(NP) swabs in vial transport medium  Result Value Ref Range Status   SARS Coronavirus 2 by RT PCR NEGATIVE NEGATIVE Final    Comment: (NOTE) SARS-CoV-2 target nucleic acids are NOT DETECTED.  The SARS-CoV-2 RNA is generally detectable in upper respiratory specimens during the acute phase of infection. The lowest concentration of SARS-CoV-2 viral copies this assay can detect is 138 copies/mL. A negative result does not preclude SARS-Cov-2 infection and should not be used as the sole basis for treatment or other patient management decisions. A negative result may occur with  improper specimen collection/handling, submission of specimen other than nasopharyngeal swab, presence of viral mutation(s) within the areas targeted by this assay, and inadequate number of viral copies(<138 copies/mL). A negative result must be combined with clinical observations, patient history, and epidemiological information. The expected result is Negative.  Fact Sheet for Patients:  EntrepreneurPulse.com.au  Fact Sheet for Healthcare Providers:  IncredibleEmployment.be  This test is no t yet approved or cleared by the [place] FDA  and  has been authorized for detection and/or diagnosis of SARS-CoV-2 by FDA under an Emergency Use Authorization (EUA). This EUA will remain  in effect (meaning this test can be used) for the duration of the COVID-19 declaration under Section 564(b)(1) of the Act, 21 U.S.C.section 360bbb-3(b)(1), unless the authorization is terminated  or revoked sooner.       Influenza A by PCR NEGATIVE NEGATIVE Final   Influenza B by PCR NEGATIVE NEGATIVE Final    Comment: (NOTE) The Xpert Xpress SARS-CoV-2/FLU/RSV plus assay is intended as an aid in the diagnosis of influenza from Nasopharyngeal swab specimens and should not be used as a sole basis for treatment. Nasal washings and aspirates are unacceptable for Xpert Xpress SARS-CoV-2/FLU/RSV testing.  Fact Sheet for Patients: EntrepreneurPulse.com.au  Fact Sheet for Healthcare Providers: IncredibleEmployment.be  This test is not yet approved or cleared by the [place] FDA and has been authorized for detection and/or diagnosis of SARS-CoV-2 by FDA under an Emergency Use Authorization (EUA). This EUA will remain in effect (meaning this test can be used) for the duration of the COVID-19 declaration under Section 564(b)(1) of the Act, 21 U.S.C. section 360bbb-3(b)(1), unless the authorization is terminated or revoked.  Performed at [facility],  [place] [address], [place], [place] [address]      Labs: BNP (last 3 results) No results for input(s): BNP in the last 8760 hours. Basic Metabolic Panel: Recent Labs  Lab 05/05/21 1316 05/06/21 0454 05/07/21 0549 05/09/21 0457  NA 139 140 138 136  K 3.5 4.1 3.7 3.7  CL 109 110 109 106  CO2 22 22 22 22   GLUCOSE 99 95 108* 111*  BUN 14 10 17 18   CREATININE 0.71 0.62 0.81 0.74  CALCIUM 8.6* 8.6* 8.7* 9.4   Liver Function Tests: Recent Labs  Lab 05/05/21 1316 05/06/21 0454  AST 20 21  ALT 20 17  ALKPHOS 78 70  BILITOT 0.5  0.8  PROT 7.5 6.8  ALBUMIN 4.4 4.1   No results for input(s): LIPASE, AMYLASE in the last 168 hours. No results for input(s): AMMONIA in the last 168 hours. CBC: Recent Labs  Lab 05/05/21 1316 05/05/21 1653 05/06/21 0454 05/06/21 1054 05/07/21 0549 05/07/21 1007 05/08/21 0452 05/08/21 1354 05/08/21 1637 05/08/21 2157 05/09/21 0457  WBC 5.6  --  5.5  --  5.7  --   --   --   --   --   --   NEUTROABS 3.4  --   --   --   --   --   --   --   --   --   --   HGB 6.7*   < > 9.3*   < > 9.9*   < > 10.7* 11.2* 11.0* 10.4* 10.7*  HCT 23.7*   < > 30.4*   < > 32.9*   < > 35.8* 37.9 36.9 34.4* 35.3*  MCV 79.5*  --  79.6*  --  79.7*  --   --   --   --   --   --   PLT 389  --  322  --  359  --   --   --   --   --   --    < > = values in this interval not displayed.   Cardiac Enzymes: No results for input(s): CKTOTAL, CKMB, CKMBINDEX, TROPONINI in the last 168 hours. BNP: Invalid input(s): POCBNP CBG: No results for input(s): GLUCAP in the last 168 hours. D-Dimer No results for input(s): DDIMER in the last 72 hours. Hgb A1c No results for input(s): HGBA1C in the last 72 hours. Lipid Profile No results for input(s): CHOL, HDL, LDLCALC, TRIG, CHOLHDL, LDLDIRECT in the last 72 hours. Thyroid function studies No results for input(s): TSH, T4TOTAL, T3FREE, THYROIDAB in the last 72 hours.  Invalid input(s): FREET3 Anemia work up No results for input(s): VITAMINB12, FOLATE, FERRITIN, TIBC, IRON, RETICCTPCT in the last 72 hours. Urinalysis    Component Value Date/Time   COLORURINE YELLOW 06/25/2016 2252   APPEARANCEUR CLOUDY (A) 06/25/2016 2252   LABSPEC 1.025 06/25/2016 2252   PHURINE 5.5 06/25/2016 2252   GLUCOSEU NEGATIVE 06/25/2016 2252   HGBUR NEGATIVE 06/25/2016 2252   BILIRUBINUR NEGATIVE 06/25/2016 2252   KETONESUR 15 (A) 06/25/2016 2252   PROTEINUR NEGATIVE 06/25/2016 2252   UROBILINOGEN 0.2 01/06/2008 1045   NITRITE NEGATIVE 06/25/2016 2252   LEUKOCYTESUR NEGATIVE  06/25/2016 2252   Sepsis Labs Invalid input(s): PROCALCITONIN,  WBC,  LACTICIDVEN Microbiology Recent Results (from the past 240 hour(s))  Resp Panel by RT-PCR (Flu A&B, Covid) Nasopharyngeal Swab     Status: None   Collection Time: 05/05/21 12:53 PM   Specimen: Nasopharyngeal Swab; Nasopharyngeal(NP) swabs in vial transport medium  Result  Value Ref Range Status   SARS Coronavirus 2 by RT PCR NEGATIVE NEGATIVE Final    Comment: (NOTE) SARS-CoV-2 target nucleic acids are NOT DETECTED.  The SARS-CoV-2 RNA is generally detectable in upper respiratory specimens during the acute phase of infection. The lowest concentration of SARS-CoV-2 viral copies this assay can detect is 138 copies/mL. A negative result does not preclude SARS-Cov-2 infection and should not be used as the sole basis for treatment or other patient management decisions. A negative result may occur with  improper specimen collection/handling, submission of specimen other than nasopharyngeal swab, presence of viral mutation(s) within the areas targeted by this assay, and inadequate number of viral copies(<138 copies/mL). A negative result must be combined with clinical observations, patient history, and epidemiological information. The expected result is Negative.  Fact Sheet for Patients:  EntrepreneurPulse.com.au  Fact Sheet for Healthcare Providers:  IncredibleEmployment.be  This test is no t yet approved or cleared by the [place] FDA and  has been authorized for detection and/or diagnosis of SARS-CoV-2 by FDA under an Emergency Use Authorization (EUA). This EUA will remain  in effect (meaning this test can be used) for the duration of the COVID-19 declaration under Section 564(b)(1) of the Act, 21 U.S.C.section 360bbb-3(b)(1), unless the authorization is terminated  or revoked sooner.       Influenza A by PCR NEGATIVE NEGATIVE Final   Influenza B by PCR NEGATIVE  NEGATIVE Final    Comment: (NOTE) The Xpert Xpress SARS-CoV-2/FLU/RSV plus assay is intended as an aid in the diagnosis of influenza from Nasopharyngeal swab specimens and should not be used as a sole basis for treatment. Nasal washings and aspirates are unacceptable for Xpert Xpress SARS-CoV-2/FLU/RSV testing.  Fact Sheet for Patients: EntrepreneurPulse.com.au  Fact Sheet for Healthcare Providers: IncredibleEmployment.be  This test is not yet approved or cleared by the [place] FDA and has been authorized for detection and/or diagnosis of SARS-CoV-2 by FDA under an Emergency Use Authorization (EUA). This EUA will remain in effect (meaning this test can be used) for the duration of the COVID-19 declaration under Section 564(b)(1) of the Act, 21 U.S.C. section 360bbb-3(b)(1), unless the authorization is terminated or revoked.  Performed at [facility], [place] [address], [place], [place] [address]      Time coordinating discharge: 40 minutes  SIGNED:   Elmarie Shiley, MD  [facility]

## 2021-05-10 ENCOUNTER — Encounter (HOSPITAL_COMMUNITY): Payer: Self-pay | Admitting: Gastroenterology

## 2021-05-18 DIAGNOSIS — D649 Anemia, unspecified: Secondary | ICD-10-CM | POA: Diagnosis not present

## 2021-05-18 DIAGNOSIS — R7301 Impaired fasting glucose: Secondary | ICD-10-CM | POA: Diagnosis not present

## 2021-05-25 DIAGNOSIS — R002 Palpitations: Secondary | ICD-10-CM | POA: Diagnosis not present

## 2021-06-02 DIAGNOSIS — D680 Von Willebrand's disease: Secondary | ICD-10-CM | POA: Diagnosis not present

## 2021-06-02 DIAGNOSIS — K2100 Gastro-esophageal reflux disease with esophagitis, without bleeding: Secondary | ICD-10-CM | POA: Diagnosis not present

## 2021-06-02 DIAGNOSIS — R002 Palpitations: Secondary | ICD-10-CM | POA: Diagnosis not present

## 2021-06-02 DIAGNOSIS — D500 Iron deficiency anemia secondary to blood loss (chronic): Secondary | ICD-10-CM | POA: Diagnosis not present

## 2021-06-02 DIAGNOSIS — R531 Weakness: Secondary | ICD-10-CM | POA: Diagnosis not present

## 2021-06-02 DIAGNOSIS — E785 Hyperlipidemia, unspecified: Secondary | ICD-10-CM | POA: Diagnosis not present

## 2021-06-02 DIAGNOSIS — R0602 Shortness of breath: Secondary | ICD-10-CM | POA: Diagnosis not present

## 2021-06-05 DIAGNOSIS — R002 Palpitations: Secondary | ICD-10-CM | POA: Diagnosis not present

## 2021-07-03 DIAGNOSIS — D509 Iron deficiency anemia, unspecified: Secondary | ICD-10-CM | POA: Diagnosis not present

## 2021-07-12 DIAGNOSIS — R7301 Impaired fasting glucose: Secondary | ICD-10-CM | POA: Diagnosis not present

## 2021-07-12 DIAGNOSIS — E890 Postprocedural hypothyroidism: Secondary | ICD-10-CM | POA: Diagnosis not present

## 2021-07-12 DIAGNOSIS — E559 Vitamin D deficiency, unspecified: Secondary | ICD-10-CM | POA: Diagnosis not present

## 2021-07-12 DIAGNOSIS — M8580 Other specified disorders of bone density and structure, unspecified site: Secondary | ICD-10-CM | POA: Diagnosis not present

## 2021-07-19 DIAGNOSIS — E890 Postprocedural hypothyroidism: Secondary | ICD-10-CM | POA: Diagnosis not present

## 2021-08-02 DIAGNOSIS — K254 Chronic or unspecified gastric ulcer with hemorrhage: Secondary | ICD-10-CM | POA: Diagnosis not present

## 2021-08-02 DIAGNOSIS — D509 Iron deficiency anemia, unspecified: Secondary | ICD-10-CM | POA: Diagnosis not present

## 2021-08-02 DIAGNOSIS — D500 Iron deficiency anemia secondary to blood loss (chronic): Secondary | ICD-10-CM | POA: Diagnosis not present

## 2021-09-29 DIAGNOSIS — D500 Iron deficiency anemia secondary to blood loss (chronic): Secondary | ICD-10-CM | POA: Diagnosis not present

## 2021-09-29 DIAGNOSIS — E785 Hyperlipidemia, unspecified: Secondary | ICD-10-CM | POA: Diagnosis not present

## 2021-09-29 DIAGNOSIS — K2100 Gastro-esophageal reflux disease with esophagitis, without bleeding: Secondary | ICD-10-CM | POA: Diagnosis not present

## 2021-10-06 DIAGNOSIS — E890 Postprocedural hypothyroidism: Secondary | ICD-10-CM | POA: Diagnosis not present

## 2021-10-06 DIAGNOSIS — D500 Iron deficiency anemia secondary to blood loss (chronic): Secondary | ICD-10-CM | POA: Diagnosis not present

## 2021-10-06 DIAGNOSIS — E785 Hyperlipidemia, unspecified: Secondary | ICD-10-CM | POA: Diagnosis not present

## 2021-10-18 DIAGNOSIS — M1811 Unilateral primary osteoarthritis of first carpometacarpal joint, right hand: Secondary | ICD-10-CM | POA: Diagnosis not present

## 2021-10-25 DIAGNOSIS — D509 Iron deficiency anemia, unspecified: Secondary | ICD-10-CM | POA: Diagnosis not present

## 2021-11-01 DIAGNOSIS — M25511 Pain in right shoulder: Secondary | ICD-10-CM | POA: Diagnosis not present

## 2021-11-06 DIAGNOSIS — M25511 Pain in right shoulder: Secondary | ICD-10-CM | POA: Diagnosis not present

## 2021-11-13 DIAGNOSIS — D509 Iron deficiency anemia, unspecified: Secondary | ICD-10-CM | POA: Diagnosis not present

## 2021-11-15 DIAGNOSIS — H04123 Dry eye syndrome of bilateral lacrimal glands: Secondary | ICD-10-CM | POA: Diagnosis not present

## 2021-11-15 DIAGNOSIS — H18513 Endothelial corneal dystrophy, bilateral: Secondary | ICD-10-CM | POA: Diagnosis not present

## 2021-11-15 DIAGNOSIS — Z961 Presence of intraocular lens: Secondary | ICD-10-CM | POA: Diagnosis not present

## 2021-11-22 DIAGNOSIS — D509 Iron deficiency anemia, unspecified: Secondary | ICD-10-CM | POA: Diagnosis not present

## 2021-12-06 ENCOUNTER — Emergency Department (HOSPITAL_COMMUNITY): Payer: PPO

## 2021-12-06 ENCOUNTER — Encounter (HOSPITAL_COMMUNITY): Payer: Self-pay | Admitting: *Deleted

## 2021-12-06 ENCOUNTER — Other Ambulatory Visit: Payer: Self-pay

## 2021-12-06 ENCOUNTER — Observation Stay (HOSPITAL_COMMUNITY)
Admission: EM | Admit: 2021-12-06 | Discharge: 2021-12-07 | Disposition: A | Discharge status: Home / Self Care | Payer: PPO | Attending: Student | Admitting: Student

## 2021-12-06 DIAGNOSIS — D649 Anemia, unspecified: Secondary | ICD-10-CM | POA: Diagnosis present

## 2021-12-06 DIAGNOSIS — E890 Postprocedural hypothyroidism: Secondary | ICD-10-CM | POA: Diagnosis not present

## 2021-12-06 DIAGNOSIS — E876 Hypokalemia: Secondary | ICD-10-CM

## 2021-12-06 DIAGNOSIS — R0602 Shortness of breath: Secondary | ICD-10-CM | POA: Diagnosis not present

## 2021-12-06 DIAGNOSIS — Z20822 Contact with and (suspected) exposure to covid-19: Secondary | ICD-10-CM | POA: Insufficient documentation

## 2021-12-06 DIAGNOSIS — D509 Iron deficiency anemia, unspecified: Secondary | ICD-10-CM | POA: Diagnosis not present

## 2021-12-06 DIAGNOSIS — R195 Other fecal abnormalities: Secondary | ICD-10-CM | POA: Diagnosis not present

## 2021-12-06 DIAGNOSIS — D6800 Von Willebrand disease, unspecified: Secondary | ICD-10-CM | POA: Diagnosis not present

## 2021-12-06 DIAGNOSIS — E039 Hypothyroidism, unspecified: Secondary | ICD-10-CM | POA: Diagnosis not present

## 2021-12-06 DIAGNOSIS — K922 Gastrointestinal hemorrhage, unspecified: Secondary | ICD-10-CM | POA: Diagnosis present

## 2021-12-06 DIAGNOSIS — K259 Gastric ulcer, unspecified as acute or chronic, without hemorrhage or perforation: Secondary | ICD-10-CM | POA: Diagnosis not present

## 2021-12-06 HISTORY — DX: Disorder of thyroid, unspecified: E07.9

## 2021-12-06 HISTORY — DX: Von Willebrand disease, unspecified: D68.00

## 2021-12-06 LAB — CBC WITH DIFFERENTIAL/PLATELET
Abs Immature Granulocytes: 0.05 10*3/uL (ref 0.00–0.07)
Basophils Absolute: 0.1 10*3/uL (ref 0.0–0.1)
Basophils Relative: 1 %
Eosinophils Absolute: 0.1 10*3/uL (ref 0.0–0.5)
Eosinophils Relative: 1 %
HCT: 21.1 % — ABNORMAL LOW (ref 36.0–46.0)
Hemoglobin: 5.8 g/dL — CL (ref 12.0–15.0)
Immature Granulocytes: 1 %
Lymphocytes Relative: 26 %
Lymphs Abs: 1.8 10*3/uL (ref 0.7–4.0)
MCH: 24.2 pg — ABNORMAL LOW (ref 26.0–34.0)
MCHC: 27.5 g/dL — ABNORMAL LOW (ref 30.0–36.0)
MCV: 87.9 fL (ref 80.0–100.0)
Monocytes Absolute: 0.6 10*3/uL (ref 0.1–1.0)
Monocytes Relative: 8 %
Neutro Abs: 4.4 10*3/uL (ref 1.7–7.7)
Neutrophils Relative %: 63 %
Platelets: 446 10*3/uL — ABNORMAL HIGH (ref 150–400)
RBC: 2.4 MIL/uL — ABNORMAL LOW (ref 3.87–5.11)
RDW: 19.7 % — ABNORMAL HIGH (ref 11.5–15.5)
WBC: 6.9 10*3/uL (ref 4.0–10.5)
nRBC: 0.6 % — ABNORMAL HIGH (ref 0.0–0.2)

## 2021-12-06 LAB — BASIC METABOLIC PANEL
Anion gap: 8 (ref 5–15)
BUN: 24 mg/dL — ABNORMAL HIGH (ref 8–23)
CO2: 20 mmol/L — ABNORMAL LOW (ref 22–32)
Calcium: 8.9 mg/dL (ref 8.9–10.3)
Chloride: 110 mmol/L (ref 98–111)
Creatinine, Ser: 0.76 mg/dL (ref 0.44–1.00)
GFR, Estimated: >60 mL/min (ref >60)
Glucose, Bld: 111 mg/dL — ABNORMAL HIGH (ref 70–99)
Potassium: 3.3 mmol/L — ABNORMAL LOW (ref 3.5–5.1)
Sodium: 138 mmol/L (ref 135–145)

## 2021-12-06 LAB — HEPATIC FUNCTION PANEL
ALT: 14 U/L (ref 0–44)
AST: 18 U/L (ref 15–41)
Albumin: 4.2 g/dL (ref 3.5–5.0)
Alkaline Phosphatase: 57 U/L (ref 38–126)
Bilirubin, Direct: <0.1 mg/dL (ref 0.0–0.2)
Total Bilirubin: 0.4 mg/dL (ref 0.3–1.2)
Total Protein: 6.7 g/dL (ref 6.5–8.1)

## 2021-12-06 LAB — PROTIME-INR
INR: 1 (ref 0.8–1.2)
Prothrombin Time: 13 seconds (ref 11.4–15.2)

## 2021-12-06 LAB — RESP PANEL BY RT-PCR (FLU A&B, COVID) ARPGX2
Influenza A by PCR: NEGATIVE
Influenza B by PCR: NEGATIVE
SARS Coronavirus 2 by RT PCR: NEGATIVE

## 2021-12-06 LAB — POC OCCULT BLOOD, ED: Fecal Occult Bld: POSITIVE — AB

## 2021-12-06 MED ORDER — SODIUM CHLORIDE 0.9 % IV SOLN
10.0000 mL/h | Freq: Once | INTRAVENOUS | Status: AC
  Administered 2021-12-06: 10 mL/h via INTRAVENOUS

## 2021-12-06 MED ORDER — ACETAMINOPHEN 650 MG RE SUPP: 650.0000 mg | Freq: Four times a day (QID) | RECTAL | Status: DC | PRN

## 2021-12-06 MED ORDER — PANTOPRAZOLE SODIUM 40 MG IV SOLR
40.0000 mg | Freq: Two times a day (BID) | INTRAVENOUS | Status: DC
  Administered 2021-12-06 – 2021-12-07 (×2): 40 mg via INTRAVENOUS
  Filled 2021-12-06 (×2): qty 40

## 2021-12-06 MED ORDER — POTASSIUM CHLORIDE CRYS ER 20 MEQ PO TBCR
20.0000 meq | EXTENDED_RELEASE_TABLET | Freq: Once | ORAL | Status: AC
  Administered 2021-12-06: 20 meq via ORAL
  Filled 2021-12-06: qty 1

## 2021-12-06 MED ORDER — ONDANSETRON HCL 4 MG PO TABS: 4.0000 mg | ORAL_TABLET | Freq: Four times a day (QID) | ORAL | Status: DC | PRN

## 2021-12-06 MED ORDER — LACTATED RINGERS IV SOLN: INTRAVENOUS | Status: DC

## 2021-12-06 MED ORDER — ONDANSETRON HCL 4 MG/2ML IJ SOLN: 4.0000 mg | Freq: Four times a day (QID) | INTRAMUSCULAR | Status: DC | PRN

## 2021-12-06 MED ORDER — LEVOTHYROXINE SODIUM 25 MCG PO TABS
125.0000 ug | ORAL_TABLET | Freq: Every day | ORAL | Status: DC
  Administered 2021-12-07: 125 ug via ORAL
  Filled 2021-12-06: qty 1

## 2021-12-06 MED ORDER — ACETAMINOPHEN 325 MG PO TABS: 650.0000 mg | ORAL_TABLET | Freq: Four times a day (QID) | ORAL | Status: DC | PRN

## 2021-12-06 MED ORDER — SENNOSIDES-DOCUSATE SODIUM 8.6-50 MG PO TABS: 1.0000 | ORAL_TABLET | Freq: Every evening | ORAL | Status: DC | PRN

## 2021-12-06 NOTE — H&P (Signed)
History and Physical    Alexis [name]:[id number] DOB: 23-Jun-1951 DOA: 12/06/2021  PCP: Alexis Seashore, MD   Patient coming from: Home  Chief Complaint: Fatigue, SOB with exertion  HPI: Alexis [name] is a 71 y.o. female with medical history significant for hypothyroidism s/p thyroidectomy, Von Willebrand disease followed by hematology at [facility] who presents with complaint of fatigue, generalized weakness and shortness of breath with exertion.  She reports she had similar symptoms in June 2022 when she was found to be significantly anemic and needed blood transfusion.  She has been in contact with her hematologist who advised her to come to the emergency room for lab work.  She reports she has been taking oral iron and has black tarry stools.  She is not able to tolerate oral iron due to stomach symptoms with it and is scheduled for iron infusion next week at [facility].  She has not noticed any blood in her stool.  She has not had abdominal pain, nausea or vomiting.  She denies any chest pain or palpitations.  She reports having shortness of breath if she exerts herself which improves when she rests.  Decreased energy level and more difficulty doing her normal activities for the last few days which prompted her to call her hematologist.  She denies any syncopal episodes, fever, chills, cough. Lives with her husband.  Denies tobacco or illicit drug use.  She drinks occasional glass of wine.  ED Course: Ms. Legore has been hemodynamically stable and afebrile in the emergency room.  She was found to have a hemoglobin of 5.8.  She was given IV Protonix and ordered 2 units of blood for transfusion.  ER PA discussed with Dr. Therisa Doyne her gastroenterologist who will see her in the morning and decide if capsule endoscopy should be performed while in the hospital.  Lab work shows WBC of 6900 hemoglobin 5.8 hematocrit 21.1 platelets 446,000 sodium 138 potassium 3.3 chloride 110 bicarb 20  creatinine 0.76 BUN 24 glucose 111 LFTs normal albumin 4.2 bilirubin 0.4.  COVID-negative.  Influenza A and B are negative.  Hospitalist service asked to admit for further management.  Review of Systems:  General: Denies fever, chills, weight loss, night sweats. Denies change in appetite HENT: Denies head trauma, headache, denies tinnitus. Denies nasal bleeding. Denies sore throat Eyes: Denies blurry vision, pain in eye, drainage.  Denies discoloration of eyes. Neck: Denies pain.  Denies swelling.  Denies pain with movement. Cardiovascular: Denies chest pain, palpitations.  Denies edema.  Denies orthopnea Respiratory: Reports shortness of breath with exertion. Denies cough or wheezing.  Gastrointestinal: Denies abdominal pain, swelling. Denies nausea, vomiting, diarrhea. Denies hematemesis. Musculoskeletal: Denies limitation of movement.  Denies deformity or swelling.  Denies myalgias. Genitourinary: Denies pelvic pain.  Denies urinary frequency or hesitancy.  Denies dysuria.  Skin: Denies rash.  Denies petechiae, purpura, ecchymosis. Neurological: Denies syncope. Denies seizure activity. Denies paresthesia. Denies slurred speech, drooping face.  Denies visual change. Psychiatric: Denies depression, anxiety. Denies hallucinations.  Past Medical History:  Diagnosis Date   Cancer [facility])    breast and thyroid   Personal history of radiation therapy    Thyroid disease    Von Willebrand disease     Past Surgical History:  Procedure Laterality Date   ABDOMINAL HYSTERECTOMY     BIOPSY  05/08/2021   Procedure: BIOPSY;  Surgeon: Ronnette Juniper, MD;  Location: [facility] ENDOSCOPY;  Service: Gastroenterology;;   BREAST LUMPECTOMY Left 2008   BREAST LUMPECTOMY Right 2010  BREAST SURGERY     cesearian     COLONOSCOPY WITH PROPOFOL N/A 05/08/2021   Procedure: COLONOSCOPY WITH PROPOFOL;  Surgeon: Ronnette Juniper, MD;  Location: [facility] ENDOSCOPY;  Service: Gastroenterology;  Laterality: N/A;  Might need  postprocedural DDAVP; hematology consultant will arrange   ESOPHAGOGASTRODUODENOSCOPY (EGD) WITH PROPOFOL N/A 05/08/2021   Procedure: ESOPHAGOGASTRODUODENOSCOPY (EGD) WITH PROPOFOL;  Surgeon: Ronnette Juniper, MD;  Location: [facility] ENDOSCOPY;  Service: Gastroenterology;  Laterality: N/A;  might need postprocedural DDAVP; Hematology Consultant to arrange   THYROIDECTOMY      Social History  reports that she quit smoking about 46 years ago. Her smoking use included cigarettes. She has never used smokeless tobacco. She reports current alcohol use of about 5.0 - 7.0 standard drinks per week. She reports that she does not use drugs.  Allergies  Allergen Reactions   Iron     Other reaction(s): stomach upset    History reviewed. No pertinent family history.   Prior to Admission medications   Medication Sig Start Date End Date Taking? Authorizing Provider  levothyroxine (SYNTHROID, LEVOTHROID) 125 MCG tablet Take 125 mcg by mouth daily before breakfast.    [provider]  pantoprazole (PROTONIX) 40 MG tablet Take 1 tablet (40 mg total) by mouth daily. 05/09/21 05/09/22  Alexis Shiley, MD    Physical Exam: Vitals:   12/06/21 1720 12/06/21 1724 12/06/21 1900  BP: (!) 149/45  124/62  Pulse: 90  86  Resp: 18  16  Temp: 98.4 F (36.9 C)    TempSrc: Oral    SpO2: 100%  98%  Weight:  66.7 kg   Height:  5' (1.524 m)     Constitutional: NAD, calm, comfortable Vitals:   12/06/21 1720 12/06/21 1724 12/06/21 1900  BP: (!) 149/45  124/62  Pulse: 90  86  Resp: 18  16  Temp: 98.4 F (36.9 C)    TempSrc: Oral    SpO2: 100%  98%  Weight:  66.7 kg   Height:  5' (1.524 m)    General: WDWN, Alert and oriented x3.  Eyes: EOMI, PERRL, conjunctivae pale.  Sclera nonicteric HENT:  [place]/AT, external ears normal.  Nares patent without epistasis.  Mucous membranes are moist. Posterior pharynx clear of any exudate Neck: Soft, normal range of motion, supple, no masses, Trachea midline Respiratory:  clear to auscultation bilaterally, no wheezing, no crackles. Normal respiratory effort. No accessory muscle use.  Cardiovascular: Regular rate and rhythm, 3/6 systolic murmur. No rubs / gallops. No extremity edema. Abdomen: Soft, no tenderness, nondistended, no rebound or guarding.  No masses palpated. Bowel sounds normoactive Musculoskeletal: FROM. No cyanosis. No joint deformity upper and lower extremities. Normal muscle tone.  Skin: Warm, dry, intact no rashes, lesions, ulcers. No induration Neurologic: CN 2-12 grossly intact.  Normal speech.  Sensation intact to light touch   Psychiatric: Normal judgment and insight. Normal mood.    Labs on Admission: I have personally reviewed following labs and imaging studies  CBC: Recent Labs  Lab 12/06/21 1747  WBC 6.9  NEUTROABS 4.4  HGB 5.8*  HCT 21.1*  MCV 87.9  PLT 446*    Basic Metabolic Panel: Recent Labs  Lab 12/06/21 1747  NA 138  K 3.3*  CL 110  CO2 20*  GLUCOSE 111*  BUN 24*  CREATININE 0.76  CALCIUM 8.9    GFR: Estimated Creatinine Clearance: 56.6 mL/min (by C-G formula based on SCr of 0.76 mg/dL).  Liver Function Tests: Recent Labs  Lab 12/06/21 1747  AST 18  ALT 14  ALKPHOS 57  BILITOT 0.4  PROT 6.7  ALBUMIN 4.2    Urine analysis:    Component Value Date/Time   COLORURINE YELLOW 06/25/2016 2252   APPEARANCEUR CLOUDY (A) 06/25/2016 2252   LABSPEC 1.025 06/25/2016 2252   PHURINE 5.5 06/25/2016 2252   GLUCOSEU NEGATIVE 06/25/2016 2252   HGBUR NEGATIVE 06/25/2016 2252   BILIRUBINUR NEGATIVE 06/25/2016 2252   KETONESUR 15 (A) 06/25/2016 2252   PROTEINUR NEGATIVE 06/25/2016 2252   UROBILINOGEN 0.2 01/06/2008 1045   NITRITE NEGATIVE 06/25/2016 2252   LEUKOCYTESUR NEGATIVE 06/25/2016 2252    Radiological Exams on Admission: DG Chest Portable 1 View  Result Date: 12/06/2021 CLINICAL DATA:  Shortness of breath EXAM: PORTABLE CHEST 1 VIEW COMPARISON:  05/05/2021 FINDINGS: Cardiac and mediastinal  contours are within normal limits. No focal pulmonary opacity. No pleural effusion or pneumothorax. No acute osseous abnormality. IMPRESSION: No acute cardiopulmonary process. Electronically Signed   By: Merilyn Baba M.D.   On: 12/06/2021 18:39    EKG: Independently reviewed.  EKG shows normal sinus rhythm nonspecific ST changes in the anteroseptal leads.  No acute ischemia.  QTc 440  Assessment/Plan Principal Problem:   Symptomatic anemia Ms. Steffek is admitted to Telemetry floor.  Transfuse 2 units of PRBC.  Monitor Hgb/Hct.  GI consulted and will see pt in am to determine if to capsule endoscopy now.  Patient has capsule endoscopy scheduled as outpatient on January 28 with [facility]. She has melanotic stool but is on oral iron.  She has history of anemia requiring blood transfusion in June 2022 with suspected slow bleed in small intestines that could not be evaluated on upper or lower endoscopy at that time. N.p.o. after midnight in GI wants to proceed with a procedure for further evaluation in the morning IV fluid hydration with LR at 50 ml/hr overnight  Active Problems:   Acute GI bleeding Is followed by [facility] who will see patient in the morning     Hypokalemia Potassium 3.3 on lab.  Check magnesium level and will replace magnesium if low.  Will replace potassium Recheck electrolytes renal function in morning with labs    Post-surgical hypothyroidism Continue home dose of levothyroxine    Von Willebrand disease Followed by hematology at [facility].  DVT prophylaxis: SCDs for DVT prophylaxis.  Code Status:   Full Code  Family Communication:  Diagnosis and plan discussed with patient.  Patient verbalized understanding agrees with plan.  Questions answered.  Further recommendations to follow as clinical indicated Disposition Plan:   Patient is from:  Home  Anticipated DC to:  Home  Anticipated DC date:  Anticipate 2 midnight or more stay in the  hospital  Time spent on admission: 55 minutes  Consults called:  Gastroenterology, Dr. [facility]  Admission status:  Inpatient  Yevonne Aline Lonnell Chaput MD [facility]  How to contact the [facility] Attending or Consulting provider [place] or covering provider during after hours [place], for this patient?   Check the care team in [facility] and look for a) attending/consulting [facility] provider listed and b) the [facility] team listed Log into [email or web address] and use [place]'s universal password to access. If you do not have the password, please contact the hospital operator. Locate the [facility] provider you are looking for under [facility] and page to a number that you can be directly reached. If you still have difficulty reaching the provider, please page the [facility] (Director  on Call) for the Hospitalists listed on amion for assistance.  12/06/2021, 8:03 PM

## 2021-12-06 NOTE — ED Provider Triage Note (Signed)
Emergency Medicine Provider Triage Evaluation Note  Alexis [name] , a 71 y.o. female  was evaluated in triage.  Pt complains of SOB, fatigue. No gum bleeding. Has been seen at [facility] and is scheduled for pill endoscopy. States she's been feeling worse over the past 3 weeks and has been progressively worse. Hemonc at [facility] recommended increased PO iron but her symptoms have worsened.  Hgb has been trending down since 9/7-->12/28-->  Scheduled for iron transfusions but has not started those yet. Last transfusion June 2022.   Review of Systems  Positive: Fatigue, sob Negative: Fever   Physical Exam  BP (!) 149/45 (BP Location: Left Arm)    Pulse 90    Temp 98.4 F (36.9 C) (Oral)    Resp 18    Ht 5' (1.524 m)    Wt 66.7 kg    LMP  (LMP Unknown)    SpO2 100%    BMI 28.71 kg/m  Gen:   Awake, no distress  , pale Resp:  Normal effort but mild tachypnea MSK:   Moves extremities without difficulty  Other:  Pale conjunctiva  Medical Decision Making  Medically screening exam initiated at 5:33 PM.  Appropriate orders placed.  Alexis [name] was informed that the remainder of the evaluation will be completed by another provider, this initial triage assessment does not replace that evaluation, and the importance of remaining in the ED until their evaluation is complete.  Type/screen and basic labs/inr   Tedd Sias, [place] 12/06/21 1739

## 2021-12-06 NOTE — ED Notes (Signed)
Informed Consent signs for blood transfusion.  All patient's questions answered

## 2021-12-06 NOTE — ED Provider Notes (Addendum)
[facility] DEPT Provider Note   CSN: [id number] Arrival date & time: 12/06/21  1715     History  Chief Complaint  Patient presents with   Abnormal Labs    Alexis [name] is a 71 y.o. female.  HPI Patient is a 71 year old female with past medical history significant for von Willebrand's disease and anemia she has been worked up by [facility] hematology oncology group.  She presents emergency room today with ongoing worsening weakness and fatigue and shortness of breath over the past 3 weeks has been progressively worsening she has dark stool but is on iron supplementation.  She follows with [facility] Dr. Therisa Doyne and seems that at some point the plan was to do a pill endoscopy.  She denies any chest pain nausea or vomiting.     Home Medications Prior to Admission medications   Medication Sig Start Date End Date Taking? Authorizing Provider  levothyroxine (SYNTHROID, LEVOTHROID) 125 MCG tablet Take 125 mcg by mouth daily before breakfast.    [provider]  pantoprazole (PROTONIX) 40 MG tablet Take 1 tablet (40 mg total) by mouth daily. 05/09/21 05/09/22  Regalado, Cassie Freer, MD      Allergies    Iron    Review of Systems   Review of Systems  Constitutional:  Positive for fatigue. Negative for chills and fever.  HENT:  Negative for congestion.   Eyes:  Negative for pain.  Respiratory:  Positive for shortness of breath. Negative for cough.   Cardiovascular:  Negative for chest pain and leg swelling.  Gastrointestinal:  Negative for abdominal pain and vomiting.  Genitourinary:  Negative for dysuria.  Musculoskeletal:  Negative for myalgias.  Skin:  Negative for rash.  Neurological:  Positive for weakness. Negative for dizziness and headaches.   Physical Exam Updated Vital Signs BP (!) 124/104    Pulse 80    Temp 98.4 F (36.9 C) (Oral)    Resp 19    Ht 5' (1.524 m)    Wt 66.7 kg    LMP  (LMP Unknown)    SpO2 99%    BMI 28.71  kg/m  Physical Exam Vitals and nursing note reviewed.  Constitutional:      General: She is not in acute distress.    Comments: Pale 71 year old female appears somewhat fatigued.  Alert and oriented x3.  Pleasant  HENT:     Head: Normocephalic and atraumatic.     Nose: Nose normal.  Eyes:     General: No scleral icterus. Cardiovascular:     Rate and Rhythm: Normal rate and regular rhythm.     Pulses: Normal pulses.     Heart sounds: Normal heart sounds.  Pulmonary:     Effort: Pulmonary effort is normal. No respiratory distress.     Breath sounds: No wheezing.     Comments: Lungs clear to auscultation Abdominal:     Palpations: Abdomen is soft.     Tenderness: There is no abdominal tenderness. There is no guarding or rebound.  Genitourinary:    Comments: Rectal exam chaperoned by bedside RN dark appearing Musculoskeletal:     Cervical back: Normal range of motion.     Right lower leg: No edema.     Left lower leg: No edema.  Skin:    General: Skin is warm and dry.     Capillary Refill: Capillary refill takes less than 2 seconds.     Comments: Pale  Neurological:  Mental Status: She is alert. Mental status is at baseline.  Psychiatric:        Mood and Affect: Mood normal.        Behavior: Behavior normal.    ED Results / Procedures / Treatments   Labs (all labs ordered are listed, but only abnormal results are displayed) Labs Reviewed  CBC WITH DIFFERENTIAL/PLATELET - Abnormal; Notable for the following components:      Result Value   RBC 2.40 (*)    Hemoglobin 5.8 (*)    HCT 21.1 (*)    MCH 24.2 (*)    MCHC 27.5 (*)    RDW 19.7 (*)    Platelets 446 (*)    nRBC 0.6 (*)    All other components within normal limits  BASIC METABOLIC PANEL - Abnormal; Notable for the following components:   Potassium 3.3 (*)    CO2 20 (*)    Glucose, Bld 111 (*)    BUN 24 (*)    All other components within normal limits  POC OCCULT BLOOD, ED - Abnormal; Notable for the  following components:   Fecal Occult Bld POSITIVE (*)    All other components within normal limits  RESP PANEL BY RT-PCR (FLU A&B, COVID) ARPGX2  PROTIME-INR  HEPATIC FUNCTION PANEL  MAGNESIUM  BASIC METABOLIC PANEL  CBC  TYPE AND SCREEN  PREPARE RBC (CROSSMATCH)    EKG EKG Interpretation  Date/Time:  Wednesday December 06 2021 17:45:07 EST Ventricular Rate:  77 PR Interval:  163 QRS Duration: 78 QT Interval:  388 QTC Calculation: 440 R Axis:   39 Text Interpretation: Sinus rhythm Anteroseptal infarct, old Borderline repolarization abnormality Confirmed by Thamas Jaegers ([id number]) on 12/06/2021 6:35:37 PM  Radiology DG Chest Portable 1 View  Result Date: 12/06/2021 CLINICAL DATA:  Shortness of breath EXAM: PORTABLE CHEST 1 VIEW COMPARISON:  05/05/2021 FINDINGS: Cardiac and mediastinal contours are within normal limits. No focal pulmonary opacity. No pleural effusion or pneumothorax. No acute osseous abnormality. IMPRESSION: No acute cardiopulmonary process. Electronically Signed   By: Merilyn Baba M.D.   On: 12/06/2021 18:39    Procedures .Critical Care Performed by: Tedd Sias, PA Authorized by: Tedd Sias, PA   Critical care provider statement:    Critical care time (minutes):  35   Critical care time was exclusive of:  Separately billable procedures and treating other patients and teaching time   Critical care was necessary to treat or prevent imminent or life-threatening deterioration of the following conditions: severe, symptomatic anemia.   Critical care was time spent personally by me on the following activities:  Development of treatment plan with patient or surrogate, review of old charts, re-evaluation of patient's condition, pulse oximetry, ordering and review of radiographic studies, ordering and review of laboratory studies, ordering and performing treatments and interventions, obtaining history from patient or surrogate, examination of patient and evaluation  of patient's response to treatment   Care discussed with: admitting provider      Medications Ordered in ED Medications  pantoprazole (PROTONIX) injection 40 mg (40 mg Intravenous Given 12/06/21 2307)  levothyroxine (SYNTHROID) tablet 125 mcg (has no administration in time range)  lactated ringers infusion ( Intravenous New Bag/Given 12/06/21 2309)  acetaminophen (TYLENOL) tablet 650 mg (has no administration in time range)    Or  acetaminophen (TYLENOL) suppository 650 mg (has no administration in time range)  senna-docusate (Senokot-S) tablet 1 tablet (has no administration in time range)  ondansetron (ZOFRAN) tablet 4 mg (has no  administration in time range)    Or  ondansetron [facility]) injection 4 mg (has no administration in time range)  0.9 %  sodium chloride infusion (10 mL/hr Intravenous New Bag/Given 12/06/21 2309)  potassium chloride SA (KLOR-CON M) CR tablet 20 mEq (20 mEq Oral Given 12/06/21 2307)    ED Course/ Medical Decision Making/ A&P Clinical Course as of 12/06/21 2350  Wed Dec 06, 2021  1902 Discussed with Dr. Therisa Doyne -- will see pt in morning. BID protonix.  [WF]    Clinical Course User Index [WF] Tedd Sias, [place]                           Medical Decision Making Amount and/or Complexity of Data Reviewed Labs: ordered. Radiology: ordered.  Risk Prescription drug management. Decision regarding hospitalization.   Patient is a 71 year old female with past medical history significant for von Willebrand's disease and anemia she has been worked up by [facility] hematology oncology group.  She presents emergency room today with ongoing worsening weakness and fatigue and shortness of breath over the past 3 weeks has been progressively worsening she has dark stool but is on iron supplementation.  She follows with [facility] Dr. Therisa Doyne and seems that at some point the plan was to do a pill endoscopy.  She denies any chest pain nausea or vomiting.  On my exam  patient is pale and unwell appearing.  Rectal exam notable for dark stool Hemoccult positive.  Patient's hemoglobin is downtrending with hemoglobin of 5.8 today.  COVID influenza negative.  Type and screen obtained patient has had full work-up by hematology oncology for her anemia I do not believe a repeat anemia panel including folate and B12 would be helpful.  BMP mild hypokalemia.  LFTs unremarkable fecal occult positive.  Discussed with Dr. Therisa Doyne of gastroenterology who will see patient during her hospital stay.  I initiated 2 units of blood to transfuse and Protonix twice daily 40 mg.               This patient presents to the ED for concern of fatigue weakness and shortness of breath, this involves a number of treatment options, and is a complaint that carries with it a high risk of complications and morbidity.  The differential diagnosis is extensive   Co morbidities: Discussed in HPI   Additional history: Reviewed in EMR  External records from outside source obtained and reviewed including [facility] hematology oncology notes   Lab Tests:  I ordered, and personally interpreted labs.  The pertinent results include:    Labs with significant abnormalities    Imaging Studies:  No imaging studies   Cardiac Monitoring:   Medicines ordered:  I ordered medication including PRBC for anemia Reevaluation of the patient after these medicines showed that the patient has not received medications at the time of my admission I have reviewed the patients home medicines and have made adjustments as needed   Test Considered:    Critical Interventions:  Severe symptomatic anemia.  Critical intervention was administration of blood   Consults:  I requested consultation with the gastroenterologist,  and discussed lab and imaging findings as well as pertinent plan - they recommend: Admission with Protonix and PVC administration    Reevaluation:  After  the interventions noted above, I reevaluated the patient and found that they have : Still not received  Social Determinants of Health:  N/A   Dispostion:  After consideration of the diagnostic results and the patients response to treatment, I feel that the patent would benefit from admission and IV administration of PRBC.    Final Clinical Impression(s) / ED Diagnoses Final diagnoses:  Symptomatic anemia    Rx / DC Orders ED Discharge Orders     None         Tedd Sias, [place] 12/06/21 2353    Luna Fuse, MD 12/10/21 2207    Tedd Sias, [place] 12/29/21 1107    Luna Fuse, MD 01/05/22 [phone]

## 2021-12-06 NOTE — ED Triage Notes (Addendum)
Pt had labs on Dec 28th with low hgb. She is scheduled for iron infusions at [facility]. Pt tachypnea in conversation and pale in triage. She was told by her PCP to come to the ED

## 2021-12-07 DIAGNOSIS — R195 Other fecal abnormalities: Secondary | ICD-10-CM | POA: Diagnosis not present

## 2021-12-07 DIAGNOSIS — D6800 Von Willebrand disease, unspecified: Secondary | ICD-10-CM

## 2021-12-07 DIAGNOSIS — D649 Anemia, unspecified: Secondary | ICD-10-CM | POA: Diagnosis not present

## 2021-12-07 DIAGNOSIS — D62 Acute posthemorrhagic anemia: Secondary | ICD-10-CM

## 2021-12-07 DIAGNOSIS — E890 Postprocedural hypothyroidism: Secondary | ICD-10-CM | POA: Diagnosis not present

## 2021-12-07 DIAGNOSIS — D509 Iron deficiency anemia, unspecified: Secondary | ICD-10-CM | POA: Diagnosis not present

## 2021-12-07 DIAGNOSIS — K922 Gastrointestinal hemorrhage, unspecified: Secondary | ICD-10-CM | POA: Diagnosis not present

## 2021-12-07 DIAGNOSIS — E876 Hypokalemia: Secondary | ICD-10-CM | POA: Diagnosis not present

## 2021-12-07 DIAGNOSIS — K259 Gastric ulcer, unspecified as acute or chronic, without hemorrhage or perforation: Secondary | ICD-10-CM | POA: Diagnosis not present

## 2021-12-07 LAB — BASIC METABOLIC PANEL
Anion gap: 6 (ref 5–15)
BUN: 13 mg/dL (ref 8–23)
CO2: 23 mmol/L (ref 22–32)
Calcium: 8.8 mg/dL — ABNORMAL LOW (ref 8.9–10.3)
Chloride: 107 mmol/L (ref 98–111)
Creatinine, Ser: 0.7 mg/dL (ref 0.44–1.00)
GFR, Estimated: >60 mL/min (ref >60)
Glucose, Bld: 106 mg/dL — ABNORMAL HIGH (ref 70–99)
Potassium: 4 mmol/L (ref 3.5–5.1)
Sodium: 136 mmol/L (ref 135–145)

## 2021-12-07 LAB — CBC
HCT: 31.1 % — ABNORMAL LOW (ref 36.0–46.0)
Hemoglobin: 9.1 g/dL — ABNORMAL LOW (ref 12.0–15.0)
MCH: 25.7 pg — ABNORMAL LOW (ref 26.0–34.0)
MCHC: 29.3 g/dL — ABNORMAL LOW (ref 30.0–36.0)
MCV: 87.9 fL (ref 80.0–100.0)
Platelets: 372 10*3/uL (ref 150–400)
RBC: 3.54 MIL/uL — ABNORMAL LOW (ref 3.87–5.11)
RDW: 17.3 % — ABNORMAL HIGH (ref 11.5–15.5)
WBC: 5.7 10*3/uL (ref 4.0–10.5)
nRBC: 0.7 % — ABNORMAL HIGH (ref 0.0–0.2)

## 2021-12-07 LAB — PREPARE RBC (CROSSMATCH)

## 2021-12-07 LAB — MAGNESIUM: Magnesium: 2.4 mg/dL (ref 1.7–2.4)

## 2021-12-07 MED ORDER — ANTIHEMOPHILIC FACTOR-VWF 250-600 UNITS IV SOLR: 40.0000 [IU]/kg | Freq: Once | INTRAVENOUS | Status: DC

## 2021-12-07 MED ORDER — ANTIHEMOPHILIC FACTOR-VWF 250-600 UNITS IV SOLR
2485.0000 [IU] | Freq: Once | INTRAVENOUS | Status: AC
  Administered 2021-12-07: 2485 [IU] via INTRAVENOUS
  Filled 2021-12-07: qty 0

## 2021-12-07 MED ORDER — SODIUM CHLORIDE 0.9 % IV SOLN
250.0000 mg | Freq: Once | INTRAVENOUS | Status: AC
  Administered 2021-12-07: 250 mg via INTRAVENOUS
  Filled 2021-12-07: qty 20

## 2021-12-07 MED ORDER — PANTOPRAZOLE SODIUM 40 MG PO TBEC: 40.0000 mg | DELAYED_RELEASE_TABLET | Freq: Every day | ORAL | 1 refills | Status: DC

## 2021-12-07 NOTE — ED Notes (Signed)
Patient completed 1st unit of blood.  No complaints voiced.  No signs of reaction noted.

## 2021-12-07 NOTE — Care Management CC44 (Signed)
Condition Code 44 Documentation Completed  Patient Details  Name: Alexis [name] MRN: [id number] Date of Birth: 29-Mar-1951   Condition Code 44 given:  Yes Patient signature on Condition Code 44 notice:  Yes Documentation of 2 MD's agreement:  Yes Code 44 added to claim:  Yes    Illene Regulus, LCSW 12/07/2021, 1:59 PM

## 2021-12-07 NOTE — Care Management Obs Status (Signed)
[facility] NOTIFICATION   Patient Details  Name: Alexis [name] MRN: [id number] Date of Birth: 05-Oct-1951   Medicare Observation Status Notification Given:  Yes    Illene Regulus, LCSW 12/07/2021, 1:59 PM

## 2021-12-07 NOTE — Discharge Summary (Signed)
Physician Discharge Summary  Alexis [name] UXN:[id number] DOB: 11-15-1951 DOA: 12/06/2021  PCP: Merrilee Seashore, MD  Admit date: 12/06/2021 Discharge date: 12/07/2021 Admitted From: Home Disposition: Home Recommendations for Outpatient Follow-up:  Follow ups as below. Patient's hematology office to arrange outpatient follow-up Patient has upcoming follow-up with GI for capsule endoscopy Please obtain CBC/BMP/Mag at follow up Please follow up on the following pending results: None Home Health: Not indicated Equipment/Devices: Not indicated Discharge Condition: Stable CODE STATUS: Full code  Follow-up Information     Merrilee Seashore, MD. Schedule an appointment as soon as possible for a visit in 1 week(s).   Specialty: Internal Medicine Contact information: [place] [address] [phone]                Hospital Course: 71 year old F with PMH of hypothyroidism, anemia, and von Willebrand disease followed by hematology at [facility] presenting with generalized weakness, shortness of breath, DOE and melanotic stool, and admitted for symptomatic acute on chronic blood loss anemia due to possible upper GI bleed in the setting of von Willebrand disease.  Hgb 7.8 (from 7.4 about 2 weeks prior)).  Hemoccult positive.  She was transfused 2 unit and Hgb improved to 9.1.  Also significant improvement in her symptoms.  Also received IV ferric gluconate 250 mg x 1.  She also received 2485 units (40 units/kg) of humate-P as recommended by Ty Hilts, midlevel at her hematologist office at [facility] without complication, and discharged home to follow-up with her PCP, hematologist and gastroenterologist.  Hematologist office to arrange outpatient follow-up.  She has upcoming appointment with GI for capsule endoscopy.  See individual problem list below for more on hospital course.  Discharge Diagnoses:  Symptomatic acute on chronic blood loss anemia  due to upper GI bleed in patient with von Willebrand: disease: Hemoccult positive.  Latest Hgb before this admission was 7.4 on 11/22/2021.  9.1 on 10/25/2021 and 12.2 on 08/02/2021.  Transfused 2 units with appropriate response.  Received IV ferric gluconate 250 mg x Symptoms improved.  Cleared for discharge by GI. Recent Labs    05/07/21 1007 05/07/21 1658 05/07/21 2359 05/08/21 0452 05/08/21 1354 05/08/21 1637 05/08/21 2157 05/09/21 0457 12/06/21 1747 12/07/21 0500  HGB 10.0* 10.7* 10.6* 10.7* 11.2* 11.0* 10.4* 10.7* 5.8* 9.1*  -Outpatient follow-up with GI as previously planned for capsule endoscopy  Von Willebrand's disease -Received IV humate-P 40 units/kg as recommended by her hematologist office at [place] office to arrange outpatient follow-up.  Hypokalemia: Replenished and resolved.  Hypothyroidism -Continue home Synthroid.  Body mass index is 28.71 kg/m.           Discharge Exam: Vitals:   12/07/21 1335 12/07/21 1500 12/07/21 1530 12/07/21 1545  BP: 132/78 (!) 159/78 101/81   Pulse: 85 83 89 86  Temp:      Resp: 17 16  20   Height:      Weight:      SpO2: 100% 100% 100% 99%  TempSrc:      BMI (Calculated):         GENERAL: No apparent distress.  Nontoxic. HEENT: MMM.  Vision and hearing grossly intact.  NECK: Supple.  No apparent JVD.  RESP: 99% on RA.  No IWOB.  Fair aeration bilaterally. CVS:  RRR. Heart sounds normal.  ABD/GI/GU: Bowel sounds present. Soft. Non tender.  MSK/EXT:  Moves extremities. No apparent deformity. No edema.  SKIN: no apparent skin lesion or wound NEURO: Awake and  alert.  Oriented appropriately.  No apparent focal neuro deficit. PSYCH: Calm. Normal affect.   Discharge Instructions  Discharge Instructions     Call MD for:  difficulty breathing, headache or visual disturbances   Complete by: As directed    Call MD for:  extreme fatigue   Complete by: As directed    Call MD for:  persistant dizziness or  light-headedness   Complete by: As directed    Diet general   Complete by: As directed    Discharge instructions   Complete by: As directed    It has been a pleasure taking care of you!  You were hospitalized due to anemia likely from slow blood loss/GI bleed.  You have been treated with blood transfusion, iron infusion and humate infusion.  We have started you on pantoprazole (Protonix) for possible gastritis which might contribute to bleeding.  Follow-up with your gastroenterologist as previously planned for capsule endoscopy.  Your hematologist at [facility] will arrange outpatient follow-up for your von Willebrand disease.  Avoid any over-the-counter pain medication other than plain Tylenol.     Take care,   Increase activity slowly   Complete by: As directed       Allergies as of 12/07/2021       Reactions   Iron    Other reaction(s): stomach upset        Medication List     TAKE these medications    levothyroxine 125 MCG tablet Commonly known as: SYNTHROID Take 125 mcg by mouth daily before breakfast.   pantoprazole 40 MG tablet Commonly known as: Protonix Take 1 tablet (40 mg total) by mouth daily.        Consultations: Gastroenterology [facility] hematology over the phone  Procedures/Studies:   DG Chest Portable 1 View  Result Date: 12/06/2021 CLINICAL DATA:  Shortness of breath EXAM: PORTABLE CHEST 1 VIEW COMPARISON:  05/05/2021 FINDINGS: Cardiac and mediastinal contours are within normal limits. No focal pulmonary opacity. No pleural effusion or pneumothorax. No acute osseous abnormality. IMPRESSION: No acute cardiopulmonary process. Electronically Signed   By: Merilyn Baba M.D.   On: 12/06/2021 18:39       The results of significant diagnostics from this hospitalization (including imaging, microbiology, ancillary and laboratory) are listed below for reference.     Microbiology: Recent Results (from the past 240 hour(s))  Resp Panel by RT-PCR  (Flu A&B, Covid) Nasopharyngeal Swab     Status: None   Collection Time: 12/06/21  5:47 PM   Specimen: Nasopharyngeal Swab; Nasopharyngeal(NP) swabs in vial transport medium  Result Value Ref Range Status   SARS Coronavirus 2 by RT PCR NEGATIVE NEGATIVE Final    Comment: (NOTE) SARS-CoV-2 target nucleic acids are NOT DETECTED.  The SARS-CoV-2 RNA is generally detectable in upper respiratory specimens during the acute phase of infection. The lowest concentration of SARS-CoV-2 viral copies this assay can detect is 138 copies/mL. A negative result does not preclude SARS-Cov-2 infection and should not be used as the sole basis for treatment or other patient management decisions. A negative result may occur with  improper specimen collection/handling, submission of specimen other than nasopharyngeal swab, presence of viral mutation(s) within the areas targeted by this assay, and inadequate number of viral copies(<138 copies/mL). A negative result must be combined with clinical observations, patient history, and epidemiological information. The expected result is Negative.  Fact Sheet for Patients:  EntrepreneurPulse.com.au  Fact Sheet for Healthcare Providers:  IncredibleEmployment.be  This test is no t yet  approved or cleared by the [place] and  has been authorized for detection and/or diagnosis of SARS-CoV-2 by FDA under an Emergency Use Authorization (EUA). This EUA will remain  in effect (meaning this test can be used) for the duration of the COVID-19 declaration under Section 564(b)(1) of the Act, 21 U.S.C.section 360bbb-3(b)(1), unless the authorization is terminated  or revoked sooner.       Influenza A by PCR NEGATIVE NEGATIVE Final   Influenza B by PCR NEGATIVE NEGATIVE Final    Comment: (NOTE) The Xpert Xpress SARS-CoV-2/FLU/RSV plus assay is intended as an aid in the diagnosis of influenza from Nasopharyngeal swab specimens  and should not be used as a sole basis for treatment. Nasal washings and aspirates are unacceptable for Xpert Xpress SARS-CoV-2/FLU/RSV testing.  Fact Sheet for Patients: EntrepreneurPulse.com.au  Fact Sheet for Healthcare Providers: IncredibleEmployment.be  This test is not yet approved or cleared by the [place] FDA and has been authorized for detection and/or diagnosis of SARS-CoV-2 by FDA under an Emergency Use Authorization (EUA). This EUA will remain in effect (meaning this test can be used) for the duration of the COVID-19 declaration under Section 564(b)(1) of the Act, 21 U.S.C. section 360bbb-3(b)(1), unless the authorization is terminated or revoked.  Performed at [facility], [place] [address] [place], [place] [address]      Labs:  CBC: Recent Labs  Lab 12/06/21 1747 12/07/21 0500  WBC 6.9 5.7  NEUTROABS 4.4  --   HGB 5.8* 9.1*  HCT 21.1* 31.1*  MCV 87.9 87.9  PLT 446* 372   BMP &GFR Recent Labs  Lab 12/06/21 1747 12/07/21 0500  NA 138 136  K 3.3* 4.0  CL 110 107  CO2 20* 23  GLUCOSE 111* 106*  BUN 24* 13  CREATININE 0.76 0.70  CALCIUM 8.9 8.8*  MG  --  2.4   Estimated Creatinine Clearance: 56.6 mL/min (by C-G formula based on SCr of 0.7 mg/dL). Liver & Pancreas: Recent Labs  Lab 12/06/21 1747  AST 18  ALT 14  ALKPHOS 57  BILITOT 0.4  PROT 6.7  ALBUMIN 4.2   No results for input(s): LIPASE, AMYLASE in the last 168 hours. No results for input(s): AMMONIA in the last 168 hours. Diabetic: No results for input(s): HGBA1C in the last 72 hours. No results for input(s): GLUCAP in the last 168 hours. Cardiac Enzymes: No results for input(s): CKTOTAL, CKMB, CKMBINDEX, TROPONINI in the last 168 hours. No results for input(s): PROBNP in the last 8760 hours. Coagulation Profile: Recent Labs  Lab 12/06/21 1747  INR 1.0   Thyroid Function Tests: No results for input(s): TSH, T4TOTAL,  FREET4, T3FREE, THYROIDAB in the last 72 hours. Lipid Profile: No results for input(s): CHOL, HDL, LDLCALC, TRIG, CHOLHDL, LDLDIRECT in the last 72 hours. Anemia Panel: No results for input(s): VITAMINB12, FOLATE, FERRITIN, TIBC, IRON, RETICCTPCT in the last 72 hours. Urine analysis:    Component Value Date/Time   COLORURINE YELLOW 06/25/2016 2252   APPEARANCEUR CLOUDY (A) 06/25/2016 2252   LABSPEC 1.025 06/25/2016 2252   PHURINE 5.5 06/25/2016 2252   GLUCOSEU NEGATIVE 06/25/2016 2252   HGBUR NEGATIVE 06/25/2016 2252   BILIRUBINUR NEGATIVE 06/25/2016 2252   KETONESUR 15 (A) 06/25/2016 2252   PROTEINUR NEGATIVE 06/25/2016 2252   UROBILINOGEN 0.2 01/06/2008 1045   NITRITE NEGATIVE 06/25/2016 2252   LEUKOCYTESUR NEGATIVE 06/25/2016 2252   Sepsis Labs: Invalid input(s): PROCALCITONIN, LACTICIDVEN   Time coordinating discharge: 45 minutes  SIGNED:  Bretta Bang  Bettina Gavia, MD  [facility] 12/07/2021, 6:30 PM

## 2021-12-07 NOTE — ED Notes (Signed)
Called blood bank to verify when blood would be ready.  No answer at this time.  Will try back shortly

## 2021-12-07 NOTE — ED Notes (Signed)
ED RN at bedside w/ 2nd ED RN to verify and start blood transfusion. Pt in NAD, A&Ox3.

## 2021-12-07 NOTE — ED Notes (Signed)
Pt receiving blood transfusion Cannot obtain 5am labs until blood completed- JI/EMT @ 0446hrs

## 2021-12-07 NOTE — Consult Note (Signed)
Referring Provider: [facility] Primary Care Physician:  Merrilee Seashore, MD Primary Gastroenterologist:  Dr. Therisa Doyne  Reason for Consultation:  Symptomatic anemia  HPI: Alexis [name] is a 71 y.o. female with medical history significant for hypothyroidism s/p thyroidectomy, Von Willebrand disease followed by hematology at [facility]  presents with symptomatic anemia.  EGD/Colonoscopy 04/2021 done for anemia and heme positive stool EGD: gastric ulcers, negative for h. Pylori Colonoscopy: normal, repeat 10 years.  Patient has history of IDA. Was planned for iron fusions with hematologist January 20th as she struggles to tolerate oral iron. She states she began to become short of breath, weak, and had heart palpitations which prompted her to call hematologist and they advised she go to ED to be evaluated. Upon arrival, hgb was 5.8  Patient has dark stools since oral iron supplementation. Denies abdominal pain. Denies nausea/vomiting. She states she is currently scheduled for capsule endoscopy 1/16 with Dr. Therisa Doyne.  Past Medical History:  Diagnosis Date   Cancer [facility])    breast and thyroid   Personal history of radiation therapy    Thyroid disease    Von Willebrand disease     Past Surgical History:  Procedure Laterality Date   ABDOMINAL HYSTERECTOMY     BIOPSY  05/08/2021   Procedure: BIOPSY;  Surgeon: Ronnette Juniper, MD;  Location: [facility] ENDOSCOPY;  Service: Gastroenterology;;   BREAST LUMPECTOMY Left 2008   BREAST LUMPECTOMY Right 2010   BREAST SURGERY     cesearian     COLONOSCOPY WITH PROPOFOL N/A 05/08/2021   Procedure: COLONOSCOPY WITH PROPOFOL;  Surgeon: Ronnette Juniper, MD;  Location: [facility] ENDOSCOPY;  Service: Gastroenterology;  Laterality: N/A;  Might need postprocedural DDAVP; hematology consultant will arrange   ESOPHAGOGASTRODUODENOSCOPY (EGD) WITH PROPOFOL N/A 05/08/2021   Procedure: ESOPHAGOGASTRODUODENOSCOPY (EGD) WITH PROPOFOL;  Surgeon: Ronnette Juniper, MD;  Location: [facility] ENDOSCOPY;   Service: Gastroenterology;  Laterality: N/A;  might need postprocedural DDAVP; Hematology Consultant to arrange   THYROIDECTOMY      Prior to Admission medications   Medication Sig Start Date End Date Taking? Authorizing Provider  levothyroxine (SYNTHROID, LEVOTHROID) 125 MCG tablet Take 125 mcg by mouth daily before breakfast.    [provider]  pantoprazole (PROTONIX) 40 MG tablet Take 1 tablet (40 mg total) by mouth daily. 05/09/21 05/09/22  Regalado, Jerald Kief A, MD    Scheduled Meds:  levothyroxine  125 mcg Oral Q0600   pantoprazole (PROTONIX) IV  40 mg Intravenous Q12H   Continuous Infusions:  lactated ringers 50 mL/hr at 12/06/21 2309   PRN Meds:.acetaminophen **OR** acetaminophen, ondansetron **OR** ondansetron (ZOFRAN) IV, senna-docusate  Allergies as of 12/06/2021 - Review Complete 12/06/2021  Allergen Reaction Noted   Iron  11/13/2021    History reviewed. No pertinent family history.  Social History   Socioeconomic History   Marital status: Married    Spouse name: Not on file   Number of children: Not on file   Years of education: Not on file   Highest education level: Not on file  Occupational History   Not on file  Tobacco Use   Smoking status: Former    Types: Cigarettes    Quit date: 39    Years since quitting: 46.0   Smokeless tobacco: Never   Tobacco comments:    Quit 40 years ago  Vaping Use   Vaping Use: Never used  Substance and Sexual Activity   Alcohol use: Yes    Alcohol/week: 5.0 - 7.0 standard drinks    Types: 5 -  7 Standard drinks or equivalent per week    Comment: "I have about 1 cocktail a night"   Drug use: No   Sexual activity: Yes    Birth control/protection: None  Other Topics Concern   Not on file  Social History Narrative   Not on file   Social Determinants of Health   Financial Resource Strain: Not on file  Food Insecurity: Not on file  Transportation Needs: Not on file  Physical Activity: Not on file  Stress:  Not on file  Social Connections: Not on file  Intimate Partner Violence: Not on file    Review of Systems: Review of Systems  Constitutional:  Positive for malaise/fatigue. Negative for chills and fever.  HENT:  Negative for hearing loss and tinnitus.   Eyes:  Negative for pain and discharge.  Respiratory:  Positive for shortness of breath. Negative for sputum production.   Cardiovascular:  Positive for palpitations. Negative for chest pain.  Gastrointestinal:  Positive for melena. Negative for abdominal pain, blood in stool, constipation, diarrhea, heartburn, nausea and vomiting.  Genitourinary:  Negative for dysuria and urgency.  Musculoskeletal:  Negative for myalgias and neck pain.  Skin:  Negative for itching and rash.  Neurological:  Negative for seizures and loss of consciousness.  Psychiatric/Behavioral:  Negative for substance abuse. The patient is not nervous/anxious.     Physical Exam:Physical Exam Constitutional:      Appearance: Normal appearance.  HENT:     Head: Normocephalic and atraumatic.     Nose: Nose normal. No congestion.     Mouth/Throat:     Mouth: Mucous membranes are moist.     Pharynx: Oropharynx is clear.  Eyes:     Extraocular Movements: Extraocular movements intact.     Comments: Conjunctival pallor  Musculoskeletal:        General: No swelling. Normal range of motion.     Cervical back: Normal range of motion and neck supple.  Skin:    General: Skin is warm and dry.  Neurological:     General: No focal deficit present.     Mental Status: She is alert and oriented to person, place, and time.  Psychiatric:        Mood and Affect: Mood normal.        Behavior: Behavior normal.        Thought Content: Thought content normal.        Judgment: Judgment normal.    Vital signs: Vitals:   12/07/21 0624 12/07/21 0627  BP: (!) 103/55 (!) 103/55  Pulse: 75 75  Resp: 16 16  Temp: 98 F (36.7 C) 98 F (36.7 C)  SpO2: 99% 99%        GI:  Lab  Results: Recent Labs    12/06/21 1747  WBC 6.9  HGB 5.8*  HCT 21.1*  PLT 446*   BMET Recent Labs    12/06/21 1747  NA 138  K 3.3*  CL 110  CO2 20*  GLUCOSE 111*  BUN 24*  CREATININE 0.76  CALCIUM 8.9   LFT Recent Labs    12/06/21 1747  PROT 6.7  ALBUMIN 4.2  AST 18  ALT 14  ALKPHOS 57  BILITOT 0.4  BILIDIR <0.1  IBILI NOT CALCULATED   PT/INR Recent Labs    12/06/21 1747  LABPROT 13.0  INR 1.0     Studies/Results: DG Chest Portable 1 View  Result Date: 12/06/2021 CLINICAL DATA:  Shortness of breath EXAM: PORTABLE CHEST 1 VIEW COMPARISON:  05/05/2021 FINDINGS: Cardiac and mediastinal contours are within normal limits. No focal pulmonary opacity. No pleural effusion or pneumothorax. No acute osseous abnormality. IMPRESSION: No acute cardiopulmonary process. Electronically Signed   By: Merilyn Baba M.D.   On: 12/06/2021 18:39    Impression: Symptomatic anemia -Hgb 5.8, MCV 87.9 (baseline appears 10-11) -BUN 24, creatinine 0.76 -Normal liver function - EGD/Colon 04/2021: gastric ulcers negative for H . Pylori, normal colonoscopy  Von Willebrand  Plan:  transfuse as needed to maintain HGB > 7  History of anemia with plans for capsule endoscopy as outpatient. Recommend once hgb is stable to continue follow up for iron transfusion and continue plans for capsule endoscopy scheduled 1/16 with Dr. Therisa Doyne. [facility] will sign off. Please contact us if we can be of any further assistance during this hospital stay.     LOS: 1 day   Garnette Scheuermann  PA-C 12/07/2021, 7:20 AM  Contact #  [phone]

## 2021-12-07 NOTE — ED Notes (Signed)
Pt ambulated to restroom w/out need for assistance. Pt denies lightheadedness, dizziness, or imbalance at this time.  Pt ambulated w/ steady gait.

## 2021-12-07 NOTE — ED Notes (Signed)
Pt given oj.

## 2021-12-07 NOTE — ED Notes (Signed)
Pt states understanding of dc instructions, importance of follow up, and prescription. Pt denies questions or concerns upon dc. Pt wheeled out of ed via wheelchair by ed tech to await husband's arrival. No belongings left in room upon dc.

## 2021-12-07 NOTE — ED Notes (Signed)
Blood transfusing without complications.  Patient currently resting quietly.  Respirations even and unlabored

## 2021-12-08 LAB — TYPE AND SCREEN
ABO/RH(D): O POS
Antibody Screen: NEGATIVE
Unit division: 0
Unit division: 0

## 2021-12-08 LAB — BPAM RBC
Blood Product Expiration Date: 202302102359
Blood Product Expiration Date: 202302102359
ISSUE DATE / TIME: 202301120242
ISSUE DATE / TIME: 202301120748
Unit Type and Rh: 5100
Unit Type and Rh: 5100

## 2021-12-08 MED FILL — Antihemophilic Factor/VWF (Human) For Inj 250-600 Unit: INTRAVENOUS | Qty: 600 | Status: AC

## 2021-12-08 MED FILL — Antihemophilic Factor/VWF (Human) For Inj 1000-2400 Unit: INTRAVENOUS | Qty: 1885 | Status: AC

## 2021-12-13 DIAGNOSIS — D509 Iron deficiency anemia, unspecified: Secondary | ICD-10-CM | POA: Diagnosis not present

## 2021-12-14 DIAGNOSIS — D500 Iron deficiency anemia secondary to blood loss (chronic): Secondary | ICD-10-CM | POA: Diagnosis not present

## 2021-12-14 DIAGNOSIS — D6800 Von Willebrand disease, unspecified: Secondary | ICD-10-CM | POA: Diagnosis not present

## 2021-12-22 ENCOUNTER — Other Ambulatory Visit: Payer: Self-pay | Admitting: Gastroenterology

## 2021-12-22 ENCOUNTER — Ambulatory Visit
Admission: RE | Admit: 2021-12-22 | Discharge: 2021-12-22 | Disposition: A | Discharge status: Home / Self Care | Payer: PPO | Source: Ambulatory Visit | Attending: Gastroenterology | Admitting: Gastroenterology

## 2021-12-22 DIAGNOSIS — T189XXA Foreign body of alimentary tract, part unspecified, initial encounter: Secondary | ICD-10-CM

## 2021-12-22 DIAGNOSIS — D500 Iron deficiency anemia secondary to blood loss (chronic): Secondary | ICD-10-CM | POA: Diagnosis not present

## 2021-12-22 DIAGNOSIS — M47816 Spondylosis without myelopathy or radiculopathy, lumbar region: Secondary | ICD-10-CM | POA: Diagnosis not present

## 2021-12-28 DIAGNOSIS — D6800 Von Willebrand disease, unspecified: Secondary | ICD-10-CM | POA: Diagnosis not present

## 2021-12-28 DIAGNOSIS — D66 Hereditary factor VIII deficiency: Secondary | ICD-10-CM | POA: Diagnosis not present

## 2021-12-28 DIAGNOSIS — D509 Iron deficiency anemia, unspecified: Secondary | ICD-10-CM | POA: Diagnosis not present

## 2021-12-28 DIAGNOSIS — D500 Iron deficiency anemia secondary to blood loss (chronic): Secondary | ICD-10-CM | POA: Diagnosis not present

## 2021-12-29 DIAGNOSIS — D500 Iron deficiency anemia secondary to blood loss (chronic): Secondary | ICD-10-CM | POA: Diagnosis not present

## 2022-01-05 DIAGNOSIS — D500 Iron deficiency anemia secondary to blood loss (chronic): Secondary | ICD-10-CM | POA: Diagnosis not present

## 2022-01-12 DIAGNOSIS — D500 Iron deficiency anemia secondary to blood loss (chronic): Secondary | ICD-10-CM | POA: Diagnosis not present

## 2022-01-19 DIAGNOSIS — D500 Iron deficiency anemia secondary to blood loss (chronic): Secondary | ICD-10-CM | POA: Diagnosis not present

## 2022-01-19 DIAGNOSIS — K5710 Diverticulosis of small intestine without perforation or abscess without bleeding: Secondary | ICD-10-CM | POA: Diagnosis not present

## 2022-01-19 DIAGNOSIS — D6800 Von Willebrand disease, unspecified: Secondary | ICD-10-CM | POA: Diagnosis not present

## 2022-02-16 DIAGNOSIS — R011 Cardiac murmur, unspecified: Secondary | ICD-10-CM | POA: Diagnosis not present

## 2022-02-16 DIAGNOSIS — E785 Hyperlipidemia, unspecified: Secondary | ICD-10-CM | POA: Diagnosis not present

## 2022-02-16 DIAGNOSIS — K6389 Other specified diseases of intestine: Secondary | ICD-10-CM | POA: Diagnosis not present

## 2022-02-16 DIAGNOSIS — D500 Iron deficiency anemia secondary to blood loss (chronic): Secondary | ICD-10-CM | POA: Diagnosis not present

## 2022-03-19 ENCOUNTER — Other Ambulatory Visit: Payer: Self-pay

## 2022-03-19 ENCOUNTER — Observation Stay (HOSPITAL_COMMUNITY)
Admission: EM | Admit: 2022-03-19 | Discharge: 2022-03-20 | Disposition: A | Discharge status: Home / Self Care | Payer: PPO | Attending: Internal Medicine | Admitting: Internal Medicine

## 2022-03-19 ENCOUNTER — Encounter (HOSPITAL_COMMUNITY): Payer: Self-pay

## 2022-03-19 DIAGNOSIS — E039 Hypothyroidism, unspecified: Secondary | ICD-10-CM | POA: Insufficient documentation

## 2022-03-19 DIAGNOSIS — D649 Anemia, unspecified: Secondary | ICD-10-CM | POA: Diagnosis not present

## 2022-03-19 DIAGNOSIS — E876 Hypokalemia: Secondary | ICD-10-CM

## 2022-03-19 DIAGNOSIS — Z79899 Other long term (current) drug therapy: Secondary | ICD-10-CM | POA: Diagnosis not present

## 2022-03-19 DIAGNOSIS — D62 Acute posthemorrhagic anemia: Secondary | ICD-10-CM | POA: Diagnosis not present

## 2022-03-19 DIAGNOSIS — Z853 Personal history of malignant neoplasm of breast: Secondary | ICD-10-CM | POA: Diagnosis not present

## 2022-03-19 DIAGNOSIS — D6800 Von Willebrand disease, unspecified: Secondary | ICD-10-CM | POA: Diagnosis not present

## 2022-03-19 DIAGNOSIS — D6801 Von willebrand disease, type 1: Secondary | ICD-10-CM

## 2022-03-19 DIAGNOSIS — R55 Syncope and collapse: Secondary | ICD-10-CM | POA: Diagnosis not present

## 2022-03-19 DIAGNOSIS — K219 Gastro-esophageal reflux disease without esophagitis: Secondary | ICD-10-CM | POA: Diagnosis not present

## 2022-03-19 DIAGNOSIS — R42 Dizziness and giddiness: Secondary | ICD-10-CM | POA: Diagnosis present

## 2022-03-19 DIAGNOSIS — Z87891 Personal history of nicotine dependence: Secondary | ICD-10-CM | POA: Diagnosis not present

## 2022-03-19 DIAGNOSIS — E890 Postprocedural hypothyroidism: Secondary | ICD-10-CM

## 2022-03-19 DIAGNOSIS — R002 Palpitations: Secondary | ICD-10-CM | POA: Diagnosis not present

## 2022-03-19 DIAGNOSIS — D500 Iron deficiency anemia secondary to blood loss (chronic): Secondary | ICD-10-CM | POA: Diagnosis not present

## 2022-03-19 DIAGNOSIS — Z85850 Personal history of malignant neoplasm of thyroid: Secondary | ICD-10-CM | POA: Diagnosis not present

## 2022-03-19 LAB — COMPREHENSIVE METABOLIC PANEL
ALT: 13 U/L (ref 0–44)
AST: 17 U/L (ref 15–41)
Albumin: 4.1 g/dL (ref 3.5–5.0)
Alkaline Phosphatase: 71 U/L (ref 38–126)
Anion gap: 7 (ref 5–15)
BUN: 21 mg/dL (ref 8–23)
CO2: 21 mmol/L — ABNORMAL LOW (ref 22–32)
Calcium: 8.9 mg/dL (ref 8.9–10.3)
Chloride: 113 mmol/L — ABNORMAL HIGH (ref 98–111)
Creatinine, Ser: 0.76 mg/dL (ref 0.44–1.00)
GFR, Estimated: >60 mL/min (ref >60)
Glucose, Bld: 117 mg/dL — ABNORMAL HIGH (ref 70–99)
Potassium: 3.3 mmol/L — ABNORMAL LOW (ref 3.5–5.1)
Sodium: 141 mmol/L (ref 135–145)
Total Bilirubin: 0.3 mg/dL (ref 0.3–1.2)
Total Protein: 6.8 g/dL (ref 6.5–8.1)

## 2022-03-19 LAB — CBC WITH DIFFERENTIAL/PLATELET
Abs Immature Granulocytes: 0.02 10*3/uL (ref 0.00–0.07)
Basophils Absolute: 0 10*3/uL (ref 0.0–0.1)
Basophils Relative: 0 %
Eosinophils Absolute: 0.1 10*3/uL (ref 0.0–0.5)
Eosinophils Relative: 2 %
HCT: 20.9 % — ABNORMAL LOW (ref 36.0–46.0)
Hemoglobin: 6.1 g/dL — CL (ref 12.0–15.0)
Immature Granulocytes: 0 %
Lymphocytes Relative: 30 %
Lymphs Abs: 1.3 10*3/uL (ref 0.7–4.0)
MCH: 25.7 pg — ABNORMAL LOW (ref 26.0–34.0)
MCHC: 29.2 g/dL — ABNORMAL LOW (ref 30.0–36.0)
MCV: 88.2 fL (ref 80.0–100.0)
Monocytes Absolute: 0.3 10*3/uL (ref 0.1–1.0)
Monocytes Relative: 7 %
Neutro Abs: 2.7 10*3/uL (ref 1.7–7.7)
Neutrophils Relative %: 61 %
Platelets: 313 10*3/uL (ref 150–400)
RBC: 2.37 MIL/uL — ABNORMAL LOW (ref 3.87–5.11)
RDW: 16.5 % — ABNORMAL HIGH (ref 11.5–15.5)
WBC: 4.5 10*3/uL (ref 4.0–10.5)
nRBC: 0.7 % — ABNORMAL HIGH (ref 0.0–0.2)

## 2022-03-19 LAB — PROTIME-INR
INR: 1 (ref 0.8–1.2)
Prothrombin Time: 12.7 seconds (ref 11.4–15.2)

## 2022-03-19 LAB — PREPARE RBC (CROSSMATCH)

## 2022-03-19 LAB — POC OCCULT BLOOD, ED: Fecal Occult Bld: POSITIVE — AB

## 2022-03-19 MED ORDER — ONDANSETRON HCL 4 MG/2ML IJ SOLN: 4.0000 mg | Freq: Four times a day (QID) | INTRAMUSCULAR | Status: DC | PRN

## 2022-03-19 MED ORDER — LEVOTHYROXINE SODIUM 125 MCG PO TABS
125.0000 ug | ORAL_TABLET | Freq: Every day | ORAL | Status: DC
  Administered 2022-03-20: 125 ug via ORAL
  Filled 2022-03-19: qty 1

## 2022-03-19 MED ORDER — DOXYCYCLINE HYCLATE 100 MG PO CAPS
100.0000 mg | ORAL_CAPSULE | Freq: Two times a day (BID) | ORAL | 0 refills | Status: DC
Start: 2022-03-19 — End: 2022-03-19

## 2022-03-19 MED ORDER — PANTOPRAZOLE SODIUM 40 MG IV SOLR
40.0000 mg | Freq: Two times a day (BID) | INTRAVENOUS | Status: DC
  Administered 2022-03-19 – 2022-03-20 (×2): 40 mg via INTRAVENOUS
  Filled 2022-03-19 (×2): qty 10

## 2022-03-19 MED ORDER — ACETAMINOPHEN 325 MG PO TABS: 650.0000 mg | ORAL_TABLET | Freq: Four times a day (QID) | ORAL | Status: DC | PRN

## 2022-03-19 MED ORDER — ANTIHEMOPHILIC FACTOR-VWF 250-600 UNITS IV SOLR
2528.0000 [IU] | Freq: Once | INTRAVENOUS | Status: DC
  Filled 2022-03-19: qty 2528

## 2022-03-19 MED ORDER — POLYETHYLENE GLYCOL 3350 17 G PO PACK: 17.0000 g | PACK | Freq: Every day | ORAL | Status: DC | PRN

## 2022-03-19 MED ORDER — ANTIHEMOPHILIC FACTOR-VWF 250-600 UNITS IV SOLR
2528.0000 [IU] | Freq: Once | INTRAVENOUS | Status: AC
  Administered 2022-03-19: 2528 [IU] via INTRAVENOUS
  Filled 2022-03-19: qty 2528

## 2022-03-19 MED ORDER — ONDANSETRON HCL 4 MG PO TABS: 4.0000 mg | ORAL_TABLET | Freq: Four times a day (QID) | ORAL | Status: DC | PRN

## 2022-03-19 MED ORDER — ACETAMINOPHEN 650 MG RE SUPP: 650.0000 mg | Freq: Four times a day (QID) | RECTAL | Status: DC | PRN

## 2022-03-19 MED ORDER — SODIUM CHLORIDE 0.9% FLUSH
3.0000 mL | Freq: Two times a day (BID) | INTRAVENOUS | Status: DC
  Administered 2022-03-19 – 2022-03-20 (×2): 3 mL via INTRAVENOUS

## 2022-03-19 MED ORDER — SODIUM CHLORIDE 0.9 % IV SOLN
20.0000 ug | Freq: Once | INTRAVENOUS | Status: AC
  Administered 2022-03-19: 20 ug via INTRAVENOUS
  Filled 2022-03-19: qty 5

## 2022-03-19 MED ORDER — POTASSIUM CHLORIDE CRYS ER 20 MEQ PO TBCR
40.0000 meq | EXTENDED_RELEASE_TABLET | Freq: Once | ORAL | Status: AC
  Administered 2022-03-19: 40 meq via ORAL
  Filled 2022-03-19: qty 2

## 2022-03-19 MED ORDER — SODIUM CHLORIDE 0.9 % IV SOLN
10.0000 mL/h | Freq: Once | INTRAVENOUS | Status: AC
  Administered 2022-03-19: 10 mL/h via INTRAVENOUS

## 2022-03-19 NOTE — ED Triage Notes (Signed)
Patient reports that she was told to come to the ED for a Hgb-6.6. Patient denies any obvious bleeding. ? ?Patient went to her PCP 3 days with symptoms of lightheadedness, near syncope, and SOB. Patient states she has had to have blood transfusions x 3 since June 2022. ?

## 2022-03-19 NOTE — Assessment & Plan Note (Addendum)
?   Patient presenting with several day history of weakness lightheadedness and dyspnea on exertion ?? Hemoglobin noted to be 6.1 on arrival ?? No evidence of gross bleeding ?? Stool Hemoccult positive ?? Transfused 2 units packed red blood cell transfusion ?? Dose of Humate given by ER provider ?? GI consulted. Pt was seen by GI who has cleared pt for d/c to follow up with primary GI as scheduled  ?

## 2022-03-19 NOTE — ED Notes (Signed)
Pt's family brought Antihemophilic Factor. It was put into fridge. ?

## 2022-03-19 NOTE — Assessment & Plan Note (Signed)
.   Resume home regimen of Synthroid 

## 2022-03-19 NOTE — ED Notes (Signed)
Per Blood Bank- blood units ready for pick up. ENMIles ?

## 2022-03-19 NOTE — Assessment & Plan Note (Addendum)
Replaced. °

## 2022-03-19 NOTE — Assessment & Plan Note (Addendum)
?   Given PPI this visit ?

## 2022-03-19 NOTE — ED Notes (Signed)
Blood consent is signed.  ?

## 2022-03-19 NOTE — ED Provider Notes (Signed)
?[facility] DEPT ?Provider Note ? ? ?CSN: [id number] ?Arrival date & time: 03/19/22  1640 ? ?  ? ?History ? ?Chief Complaint  ?Patient presents with  ? Shortness of Breath  ? Dizziness  ? Abnormal Lab  ? Near Syncope  ? ? ?Alexis [name] is a 71 y.o. female. ? ?Patient is a 71 year old female with past medical history of von Willebrand's disease and anemia requiring blood transfusions from GI bleed presenting for complaints of anemia.  Patient states she has been seen by [facility] GI specialist Dr.Karki with completed upper and lower endoscopy for anemia with positive occult stool samples with no source of bleeding identified.  States she then followed up with [facility] specialist Dr. Rolin Barry completed a double-balloon endoscopy with no source of bleeding identified.  Patient states despite positive occult stool samples she has had no visible rectal bleeding, black, or bloody stools. ? ?Sees Heme/onc at [facility] Dr. Hart Robinsons von willebrand's disease, receives multiple iron transfusions.. on Humate.  ? ?The history is provided by the patient. No language interpreter was used.  ?Shortness of Breath ?Associated symptoms: no abdominal pain, no chest pain, no cough, no ear pain, no fever, no rash, no sore throat and no vomiting   ?Dizziness ?Associated symptoms: shortness of breath   ?Associated symptoms: no blood in stool, no chest pain, no palpitations and no vomiting   ?Abnormal Lab ?Near Syncope ?Associated symptoms include shortness of breath. Pertinent negatives include no chest pain and no abdominal pain.  ? ?  ? ?Home Medications ?Prior to Admission medications   ?Medication Sig Start Date End Date Taking? Authorizing Provider  ?levothyroxine (SYNTHROID, LEVOTHROID) 125 MCG tablet Take 125 mcg by mouth daily before breakfast.    [provider]  ?pantoprazole (PROTONIX) 40 MG tablet Take 1 tablet (40 mg total) by mouth daily. 12/07/21 02/05/22  Mercy Riding, MD  ?    ? ?Allergies    ?Iron   ? ?Review of Systems   ?Review of Systems  ?Constitutional:  Positive for fatigue. Negative for chills and fever.  ?HENT:  Negative for ear pain and sore throat.   ?Eyes:  Negative for pain and visual disturbance.  ?Respiratory:  Positive for shortness of breath. Negative for cough.   ?Cardiovascular:  Positive for near-syncope. Negative for chest pain and palpitations.  ?Gastrointestinal:  Negative for abdominal pain, blood in stool and vomiting.  ?Genitourinary:  Negative for dysuria and hematuria.  ?Musculoskeletal:  Negative for arthralgias and back pain.  ?Skin:  Negative for color change and rash.  ?Neurological:  Positive for dizziness. Negative for seizures, syncope and light-headedness.  ?All other systems reviewed and are negative. ? ?Physical Exam ?Updated Vital Signs ?BP 129/64   Pulse 84   Temp 97.8 ?F (36.6 ?C) (Oral)   Resp (!) 21   Ht 5' (1.524 m)   Wt 67.1 kg   LMP  (LMP Unknown)   SpO2 99%   BMI 28.90 kg/m?  ?Physical Exam ?Vitals and nursing note reviewed.  ?Constitutional:   ?   General: She is not in acute distress. ?   Appearance: She is well-developed.  ?HENT:  ?   Head: Normocephalic and atraumatic.  ?Eyes:  ?   Conjunctiva/sclera: Conjunctivae normal.  ?Cardiovascular:  ?   Rate and Rhythm: Normal rate and regular rhythm.  ?   Heart sounds: No murmur heard. ?Pulmonary:  ?   Effort: Pulmonary effort is normal. No respiratory distress.  ?  Breath sounds: Normal breath sounds.  ?Abdominal:  ?   Palpations: Abdomen is soft.  ?   Tenderness: There is no abdominal tenderness.  ?Musculoskeletal:     ?   General: No swelling.  ?   Cervical back: Neck supple.  ?Skin: ?   General: Skin is warm and dry.  ?   Capillary Refill: Capillary refill takes less than 2 seconds.  ?Neurological:  ?   Mental Status: She is alert.  ?Psychiatric:     ?   Mood and Affect: Mood normal.  ? ? ?ED Results / Procedures / Treatments   ?Labs ?(all labs ordered are listed, but only abnormal  results are displayed) ?Labs Reviewed  ?COMPREHENSIVE METABOLIC PANEL  ?CBC WITH DIFFERENTIAL/PLATELET  ?PROTIME-INR  ?POC OCCULT BLOOD, ED  ?TYPE AND SCREEN  ? ? ?EKG ?None ? ?Radiology ?No results found. ? ?Procedures ?Procedures  ? ? ?Medications Ordered in ED ?Medications - No data to display ? ?ED Course/ Medical Decision Making/ A&P ?  ?                        ?Medical Decision Making ?Amount and/or Complexity of Data Reviewed ?Labs: ordered. ? ?Risk ?Prescription drug management. ?Decision regarding hospitalization. ? ? ?33:29 PM ?71 year old female with past medical history of von Willebrand's disease and anemia requiring blood transfusions from GI bleed presenting for complaints of anemia.  ? ?Hemoglobin 6.1 ?2 units of prbc ordered  ?20 mcg DDAVP ordered ?Patient on Humate at home.  ? ?I spoke with hematologist Dr. Lanice Schwab who states they do not give Humate unless the patient has a significant active bleed.  ? ?I spoke with admitting phsyician Dr. Wilber Bihari who agrees to accept patient. GI here up to date however will likely defer to [facility] GI team's plan for planned repeat capsule endoscopy in May as long as bleed remains stable.  ? ? ? ? ? ? ? ?Final Clinical Impression(s) / ED Diagnoses ?Final diagnoses:  ?Anemia, unspecified type  ?Von Willebrand disease ([place])  ? ? ?Rx / DC Orders ?ED Discharge Orders   ? ? None  ? ?  ? ? ?  ?Lianne Cure, DO ?41/66/06 0001 ? ?

## 2022-03-19 NOTE — ED Notes (Signed)
Patient is ready for transport. Report given to [place], [place] ?

## 2022-03-19 NOTE — ED Notes (Addendum)
Per [email or web address]@ Pt Placement- bed assignment is changing to [facility]. RN advised. ENMiles ?Update: Per Cecille Rubin- pt bed assignment will be on [address]. ENMiles ?

## 2022-03-19 NOTE — H&P (Signed)
?History and Physical  ? ? ?Patient: Alexis [name] MRN: [id number] DOA: 03/19/2022 ? ?Date of Service: the patient was seen and examined on 03/19/2022 ? ?Patient coming from: PCP office ? ?Chief Complaint:  ?Chief Complaint  ?Patient presents with  ? Shortness of Breath  ? Dizziness  ? Abnormal Lab  ? Near Syncope  ? ? ?HPI:  ? ?71 year old female with past medical history of von Willebrand's disease (type 1a), thyroid cancer 2009 status post thyroidectomy with postsurgical hypothyroidism, gastroesophageal reflux disease, iron deficiency anemia, intraductal papilloma of the right breast 2010 status post lumpectomy, DCIS of the left breast 2008 status post lumpectomy/radiation and multiple hospitalizations for recurrent suspected gastrointestinal bleeding (04/2021 and 11/2021) presenting to [facility] long hospital emergency department at the direction of her primary care provider due to low hemoglobin. ? ?Of note, patient has historically been suffering from persisting iron deficiency anemia since mid 2022 requiring blood transfusion in 04/2021 and again in 10/2021.  Patient initially underwent upper and lower endoscopy in 04/2021 with EGD revealing a nonbleeding gastric ulcer.  Patient initially followed with Dr. Deno Etienne with [facility] gastroenterology and was worked up further with a capsule endoscopy 11/2021 which revealed images concerning for possible small bowel mass.  Patient was referred to Dr. Arsenio Loader at [facility] GI with plans to perform a double-balloon enteroscopy which was eventually performed on 2/24 and was found to be unremarkable.  Currently, plan is for patient to undergo a repeat capsule enteroscopy followed by a possible repeat double-balloon enteroscopy afterwards these have yet to be performed. ? ?Now, patient explains that for the past 3 days she has been experiencing episodes of lightheadedness, worse with rising from a seated position.  This has been associated with generalized weakness as well as complaints of  dyspnea on exertion.  Patient denies any associated chest pain.  Patient denies any recent bloody stool or melena or hematemesis. ? ?Patient symptoms persisted, prompting her to follow-up with her primary care provider who performed a CBC, revealing her hemoglobin to be 6.6.  Her primary care provider instructed her to come to [facility] emergency department for evaluation for blood transfusion.   ? ?Upon evaluation in the emergency department, hemoglobin was confirmed to be 6.1 with an INR of 1.0 and platelet count of 313.  A dose of Humate was administered.   Hemoccult was positive.  ER provider discussed case with Dr. Alvy Bimler with hematology who did not recommend any further Humate/factor VIII therapy unless patient was grossly bleeding.  ER provider additionally discussed case with Dr. Watt Climes with [facility] gastroenterology who stated that it was appropriate for patient to be hospitalized at [facility] long for blood transfusion with coordination with them with plans to eventually be discharged to follow-up again with [facility].  Hospitalist group was then called to assess the patient for nurse in the hospital. ? ? ? ?Review of Systems: Review of Systems  ?Respiratory:  Positive for shortness of breath.   ?Neurological:  Positive for dizziness and weakness.  ?All other systems reviewed and are negative. ? ? ?Past Medical History:  ?Diagnosis Date  ? Cancer [facility])   ? breast and thyroid  ? Personal history of radiation therapy   ? Thyroid disease   ? Von Willebrand disease ([place])   ? ? ?Past Surgical History:  ?Procedure Laterality Date  ? ABDOMINAL HYSTERECTOMY    ? BIOPSY  05/08/2021  ? Procedure: BIOPSY;  Surgeon: Ronnette Juniper, MD;  Location: [facility];  Service: Gastroenterology;;  ?  BREAST LUMPECTOMY Left 2008  ? BREAST LUMPECTOMY Right 2010  ? BREAST SURGERY    ? cesearian    ? COLONOSCOPY WITH PROPOFOL N/A 05/08/2021  ? Procedure: COLONOSCOPY WITH PROPOFOL;  Surgeon: Ronnette Juniper, MD;  Location: [facility] ENDOSCOPY;   Service: Gastroenterology;  Laterality: N/A;  Might need postprocedural DDAVP; hematology consultant will arrange  ? ESOPHAGOGASTRODUODENOSCOPY (EGD) WITH PROPOFOL N/A 05/08/2021  ? Procedure: ESOPHAGOGASTRODUODENOSCOPY (EGD) WITH PROPOFOL;  Surgeon: Ronnette Juniper, MD;  Location: [facility] ENDOSCOPY;  Service: Gastroenterology;  Laterality: N/A;  might need postprocedural DDAVP; Hematology Consultant to arrange  ? THYROIDECTOMY    ? ? ?Social History:  reports that she quit smoking about 46 years ago. Her smoking use included cigarettes. She has never used smokeless tobacco. She reports current alcohol use of about 5.0 - 7.0 standard drinks per week. She reports that she does not use drugs. ? ?Allergies  ?Allergen Reactions  ? Iron   ?  Other reaction(s): stomach upset  ? ? ?History reviewed. No pertinent family history. ? ?Prior to Admission medications   ?Medication Sig Start Date End Date Taking? Authorizing Provider  ?levothyroxine (SYNTHROID, LEVOTHROID) 125 MCG tablet Take 125 mcg by mouth daily before breakfast.    [provider]  ?pantoprazole (PROTONIX) 40 MG tablet Take 1 tablet (40 mg total) by mouth daily. 12/07/21 02/05/22  Mercy Riding, MD  ? ? ?Physical Exam: ? ?Vitals:  ? 03/19/22 2200 03/19/22 2235 03/19/22 2236 03/19/22 2251  ?BP: 126/60 (!) 114/57  128/70  ?Pulse: 87 89  88  ?Resp: 18 17  (!) 22  ?Temp:  98.7 ?F (37.1 ?C)  99.1 ?F (37.3 ?C)  ?TempSrc:   Oral Oral  ?SpO2: 100% 100%    ?Weight:      ?Height:      ? ? ? ?Constitutional: Awake alert and oriented x3, no associated distress.   ?Skin: no rashes, no lesions, good skin turgor noted. ?Eyes: Pupils are equally reactive to light.  No evidence of scleral icterus or conjunctival pallor.  ?ENMT: Moist mucous membranes noted.  Posterior pharynx clear of any exudate or lesions.   ?Neck: normal, supple, no masses, no thyromegaly.  No evidence of jugular venous distension.   ?Respiratory: clear to auscultation bilaterally, no wheezing, no crackles.  Normal respiratory effort. No accessory muscle use.  ?Cardiovascular: Regular rate and rhythm, no murmurs / rubs / gallops. No extremity edema. 2+ pedal pulses. No carotid bruits.  ?Chest:   Nontender without crepitus or deformity.   ?Back:   Nontender without crepitus or deformity. ?Abdomen: Abdomen is soft and nontender.  No evidence of intra-abdominal masses.  Positive bowel sounds noted in all quadrants.   ?Musculoskeletal: No joint deformity upper and lower extremities. Good ROM, no contractures. Normal muscle tone.  ?Neurologic: CN 2-12 grossly intact. Sensation intact.  Patient moving all 4 extremities spontaneously.  Patient is following all commands.  Patient is responsive to verbal stimuli.   ?Psychiatric: Patient exhibits normal mood with appropriate affect.  Patient seems to possess insight as to their current situation.   ? ?Data Reviewed: ? ?I have personally reviewed and interpreted labs, imaging. ? ?Significant findings are stool Hemoccult positive, hemoglobin 6.1, hematocrit 20.9, platelet count 313, MCV of 88.2.  Chemistry revealing potassium 3.3, serum bicarbonate of 21, creatinine 0.76. ? ?EKG: Personally reviewed.  Rhythm is normal sinus rhythm with heart rate of 79 bpm.  No dynamic ST segment changes appreciated. ? ? ? ?Assessment and Plan: ?* Acute on chronic blood  loss anemia ?Patient presenting with several day history of weakness lightheadedness and dyspnea on exertion ?Hemoglobin noted to be 6.1 on arrival ?No evidence of gross bleeding ?Stool Hemoccult positive ?Initiating to unit packed red blood cell transfusion ?Dose of Humate given by ER provider although in the absence of active bleeding no further dosing is likely needed ?ER provider additionally discussed case with Dr. Alvy Bimler with hematology who does not feel that any further doses of Humate are necessary unless there is gross hemorrhage.  They recommend formally consulting them on an as-needed basis ?ER provider additionally  discussed case with Dr. Watt Climes with [facility] gastroenterology who stated that they would consult on the patient but likely would stick with the plan of care set by [facility] GI which is for a repeat outpatient ca

## 2022-03-19 NOTE — Assessment & Plan Note (Addendum)
?   Case was discussed with Hematology this visit ?? Continue outpatient follow-up with [facility] hematology ?

## 2022-03-20 DIAGNOSIS — E039 Hypothyroidism, unspecified: Secondary | ICD-10-CM | POA: Diagnosis not present

## 2022-03-20 DIAGNOSIS — E876 Hypokalemia: Secondary | ICD-10-CM | POA: Diagnosis not present

## 2022-03-20 DIAGNOSIS — Z87891 Personal history of nicotine dependence: Secondary | ICD-10-CM | POA: Diagnosis not present

## 2022-03-20 DIAGNOSIS — Z79899 Other long term (current) drug therapy: Secondary | ICD-10-CM | POA: Diagnosis not present

## 2022-03-20 DIAGNOSIS — R55 Syncope and collapse: Secondary | ICD-10-CM | POA: Diagnosis not present

## 2022-03-20 DIAGNOSIS — D649 Anemia, unspecified: Secondary | ICD-10-CM | POA: Diagnosis not present

## 2022-03-20 DIAGNOSIS — Z853 Personal history of malignant neoplasm of breast: Secondary | ICD-10-CM | POA: Diagnosis not present

## 2022-03-20 DIAGNOSIS — R195 Other fecal abnormalities: Secondary | ICD-10-CM | POA: Diagnosis not present

## 2022-03-20 DIAGNOSIS — E890 Postprocedural hypothyroidism: Secondary | ICD-10-CM | POA: Diagnosis not present

## 2022-03-20 DIAGNOSIS — D6801 Von willebrand disease, type 1: Secondary | ICD-10-CM | POA: Diagnosis not present

## 2022-03-20 DIAGNOSIS — D509 Iron deficiency anemia, unspecified: Secondary | ICD-10-CM | POA: Diagnosis not present

## 2022-03-20 DIAGNOSIS — D62 Acute posthemorrhagic anemia: Secondary | ICD-10-CM | POA: Diagnosis not present

## 2022-03-20 DIAGNOSIS — Z85850 Personal history of malignant neoplasm of thyroid: Secondary | ICD-10-CM | POA: Diagnosis not present

## 2022-03-20 LAB — COMPREHENSIVE METABOLIC PANEL
ALT: 11 U/L (ref 0–44)
AST: 14 U/L — ABNORMAL LOW (ref 15–41)
Albumin: 3.6 g/dL (ref 3.5–5.0)
Alkaline Phosphatase: 58 U/L (ref 38–126)
Anion gap: 6 (ref 5–15)
BUN: 19 mg/dL (ref 8–23)
CO2: 20 mmol/L — ABNORMAL LOW (ref 22–32)
Calcium: 8.1 mg/dL — ABNORMAL LOW (ref 8.9–10.3)
Chloride: 112 mmol/L — ABNORMAL HIGH (ref 98–111)
Creatinine, Ser: 0.58 mg/dL (ref 0.44–1.00)
GFR, Estimated: >60 mL/min (ref >60)
Glucose, Bld: 97 mg/dL (ref 70–99)
Potassium: 3.8 mmol/L (ref 3.5–5.1)
Sodium: 138 mmol/L (ref 135–145)
Total Bilirubin: 0.4 mg/dL (ref 0.3–1.2)
Total Protein: 5.9 g/dL — ABNORMAL LOW (ref 6.5–8.1)

## 2022-03-20 LAB — CBC
HCT: 24 % — ABNORMAL LOW (ref 36.0–46.0)
HCT: 25.8 % — ABNORMAL LOW (ref 36.0–46.0)
Hemoglobin: 7.4 g/dL — ABNORMAL LOW (ref 12.0–15.0)
Hemoglobin: 7.8 g/dL — ABNORMAL LOW (ref 12.0–15.0)
MCH: 27.2 pg (ref 26.0–34.0)
MCH: 27.2 pg (ref 26.0–34.0)
MCHC: 30.8 g/dL (ref 30.0–36.0)
MCHC: 30.2 g/dL (ref 30.0–36.0)
MCV: 88.2 fL (ref 80.0–100.0)
MCV: 89.9 fL (ref 80.0–100.0)
Platelets: 233 10*3/uL (ref 150–400)
Platelets: 229 10*3/uL (ref 150–400)
RBC: 2.72 MIL/uL — ABNORMAL LOW (ref 3.87–5.11)
RBC: 2.87 MIL/uL — ABNORMAL LOW (ref 3.87–5.11)
RDW: 15.7 % — ABNORMAL HIGH (ref 11.5–15.5)
RDW: 15.6 % — ABNORMAL HIGH (ref 11.5–15.5)
WBC: 4.9 10*3/uL (ref 4.0–10.5)
WBC: 4.4 10*3/uL (ref 4.0–10.5)
nRBC: 0.4 % — ABNORMAL HIGH (ref 0.0–0.2)
nRBC: 0.5 % — ABNORMAL HIGH (ref 0.0–0.2)

## 2022-03-20 LAB — MAGNESIUM: Magnesium: 2.1 mg/dL (ref 1.7–2.4)

## 2022-03-20 NOTE — Hospital Course (Signed)
71 year old female with past medical history of von Willebrand's disease (type 1a), thyroid cancer 2009 status post thyroidectomy with postsurgical hypothyroidism, gastroesophageal reflux disease, iron deficiency anemia, intraductal papilloma of the right breast 2010 status post lumpectomy, DCIS of the left breast 2008 status post lumpectomy/radiation and multiple hospitalizations for recurrent suspected gastrointestinal bleeding (04/2021 and 11/2021) presenting to [facility] long hospital emergency department at the direction of her primary care provider due to low hemoglobin. ?

## 2022-03-20 NOTE — Discharge Summary (Signed)
?Physician Discharge Summary ?  ?Patient: Alexis [name] MRN: [id number] DOB: Apr 05, 1951  ?Admit date:     03/19/2022  ?Discharge date: 03/20/22  ?Discharge Physician: Marylu Lund  ? ?PCP: Merrilee Seashore, MD  ? ?Recommendations at discharge:  ? ? Follow up with PCP in 1-2 weeks ?Follow up with GI as scheduled ? ?Discharge Diagnoses: ?Principal Problem: ?  Acute on chronic blood loss anemia ?Active Problems: ?  GERD without esophagitis ?  Hypokalemia ?  Post-surgical hypothyroidism ?  Von Willebrand disease type IA ([place]) ? ?Resolved Problems: ?  * No resolved hospital problems. * ? ?Hospital Course: ?71 year old female with past medical history of von Willebrand's disease (type 1a), thyroid cancer 2009 status post thyroidectomy with postsurgical hypothyroidism, gastroesophageal reflux disease, iron deficiency anemia, intraductal papilloma of the right breast 2010 status post lumpectomy, DCIS of the left breast 2008 status post lumpectomy/radiation and multiple hospitalizations for recurrent suspected gastrointestinal bleeding (04/2021 and 11/2021) presenting to [facility] long hospital emergency department at the direction of her primary care provider due to low hemoglobin. ? ?Assessment and Plan: ?* Acute on chronic blood loss anemia ?Patient presenting with several day history of weakness lightheadedness and dyspnea on exertion ?Hemoglobin noted to be 6.1 on arrival ?No evidence of gross bleeding ?Stool Hemoccult positive ?Transfused 2 units packed red blood cell transfusion ?Dose of Humate given by ER provider ?GI consulted. Pt was seen by GI who has cleared pt for d/c to follow up with primary GI as scheduled  ? ?GERD without esophagitis ?Given PPI this visit ? ?Hypokalemia ?Replaced ? ? ?Post-surgical hypothyroidism ?Resume home regimen of Synthroid ? ? ? ?Von Willebrand disease type IA ([place]) ?Case was discussed with Hematology this visit ?Continue outpatient follow-up with [facility] hematology ? ? ? ? ?   ? ? ?Consultants: GI ?Procedures performed:   ?Disposition: Home ?Diet recommendation:  ?Regular diet ?DISCHARGE MEDICATION: ?Allergies as of 03/20/2022   ? ?   Reactions  ? Iron   ? Higher doses cause stomach upset  ? ?  ? ?  ?Medication List  ?  ? ?TAKE these medications   ? ?levothyroxine 125 MCG tablet ?Commonly known as: SYNTHROID ?Take 125 mcg by mouth daily before breakfast. ?  ?pantoprazole 40 MG tablet ?Commonly known as: Protonix ?Take 1 tablet (40 mg total) by mouth daily. ?  ? ?  ? ? Follow-up Information   ? ? Merrilee Seashore, MD Follow up in 2 week(s).   ?Specialty: Internal Medicine ?Why: Hospital follow up ?Contact information: ?[place] [address] ?[phone] ? ? ?  ?  ? ? Follow up with GI as scheduled Follow up.   ? ?  ?  ? ?  ?  ? ?  ? ?Discharge Exam: ?Danley Danker Weights  ? 03/19/22 1656  ?Weight: 67.1 kg  ? ?General exam: Awake, laying in bed, in nad ?Respiratory system: Normal respiratory effort, no wheezing ?Cardiovascular system: regular rate, s1, s2 ?Gastrointestinal system: Soft, nondistended, positive BS ?Central nervous system: CN2-12 grossly intact, strength intact ?Extremities: Perfused, no clubbing ?Skin: Normal skin turgor, no notable skin lesions seen ?Psychiatry: Mood normal // no visual hallucinations  ? ?Condition at discharge: fair ? ?The results of significant diagnostics from this hospitalization (including imaging, microbiology, ancillary and laboratory) are listed below for reference.  ? ?Imaging Studies: ?No results found. ? ?Microbiology: ?Results for orders placed or performed during the hospital encounter of 12/06/21  ?Resp Panel by RT-PCR (Flu A&B, Covid) Nasopharyngeal Swab  Status: None  ? Collection Time: 12/06/21  5:47 PM  ? Specimen: Nasopharyngeal Swab; Nasopharyngeal(NP) swabs in vial transport medium  ?Result Value Ref Range Status  ? SARS Coronavirus 2 by RT PCR NEGATIVE NEGATIVE Final  ?  Comment: (NOTE) ?SARS-CoV-2 target  nucleic acids are NOT DETECTED. ? ?The SARS-CoV-2 RNA is generally detectable in upper respiratory ?specimens during the acute phase of infection. The lowest ?concentration of SARS-CoV-2 viral copies this assay can detect is ?138 copies/mL. A negative result does not preclude SARS-Cov-2 ?infection and should not be used as the sole basis for treatment or ?other patient management decisions. A negative result may occur with  ?improper specimen collection/handling, submission of specimen other ?than nasopharyngeal swab, presence of viral mutation(s) within the ?areas targeted by this assay, and inadequate number of viral ?copies(<138 copies/mL). A negative result must be combined with ?clinical observations, patient history, and epidemiological ?information. The expected result is Negative. ? ?Fact Sheet for Patients:  ?EntrepreneurPulse.com.au ? ?Fact Sheet for Healthcare Providers:  ?IncredibleEmployment.be ? ?This test is no t yet approved or cleared by the [place] FDA and  ?has been authorized for detection and/or diagnosis of SARS-CoV-2 by ?FDA under an Emergency Use Authorization (EUA). This EUA will remain  ?in effect (meaning this test can be used) for the duration of the ?COVID-19 declaration under Section 564(b)(1) of the Act, 21 ?U.S.C.section 360bbb-3(b)(1), unless the authorization is terminated  ?or revoked sooner.  ? ? ?  ? Influenza A by PCR NEGATIVE NEGATIVE Final  ? Influenza B by PCR NEGATIVE NEGATIVE Final  ?  Comment: (NOTE) ?The Xpert Xpress SARS-CoV-2/FLU/RSV plus assay is intended as an aid ?in the diagnosis of influenza from Nasopharyngeal swab specimens and ?should not be used as a sole basis for treatment. Nasal washings and ?aspirates are unacceptable for Xpert Xpress SARS-CoV-2/FLU/RSV ?testing. ? ?Fact Sheet for Patients: ?EntrepreneurPulse.com.au ? ?Fact Sheet for Healthcare  Providers: ?IncredibleEmployment.be ? ?This test is not yet approved or cleared by the [place] FDA and ?has been authorized for detection and/or diagnosis of SARS-CoV-2 by ?FDA under an Emergency Use Authorization (EUA). This EUA will remain ?in effect (meaning this test can be used) for the duration of the ?COVID-19 declaration under Section 564(b)(1) of the Act, 21 U.S.C. ?section 360bbb-3(b)(1), unless the authorization is terminated or ?revoked. ? ?Performed at [facility], [address] ?[place], [place] [address] ?  ? ? ?Labs: ?CBC: ?Recent Labs  ?Lab 03/19/22 ?1710 03/20/22 ?0559 03/20/22 ?0915  ?WBC 4.5 4.9 4.4  ?NEUTROABS 2.7  --   --   ?HGB 6.1* 7.4* 7.8*  ?HCT 20.9* 24.0* 25.8*  ?MCV 88.2 88.2 89.9  ?PLT 313 233 229  ? ?Basic Metabolic Panel: ?Recent Labs  ?Lab 03/19/22 ?1710 03/20/22 ?0559  ?NA 141 138  ?K 3.3* 3.8  ?CL 113* 112*  ?CO2 21* 20*  ?GLUCOSE 117* 97  ?BUN 21 19  ?CREATININE 0.76 0.58  ?CALCIUM 8.9 8.1*  ?MG  --  2.1  ? ?Liver Function Tests: ?Recent Labs  ?Lab 03/19/22 ?1710 03/20/22 ?0559  ?AST 17 14*  ?ALT 13 11  ?ALKPHOS 71 58  ?BILITOT 0.3 0.4  ?PROT 6.8 5.9*  ?ALBUMIN 4.1 3.6  ? ?CBG: ?No results for input(s): GLUCAP in the last 168 hours. ? ?Discharge time spent: less than 30 minutes. ? ?Signed: ?Marylu Lund, MD ?[facility] ?03/20/2022 ?

## 2022-03-20 NOTE — Consult Note (Signed)
Referring Provider: TRH ?Primary Care Physician:  Merrilee Seashore, MD ?Primary Gastroenterologist: Dr. Acie Fredrickson [facility] ? ?Reason for Consultation: Severe iron deficiency anemia ? ?HPI: Alexis [name] is a 71 y.o. female past medical history of von Willebrand's disease (type 1a), thyroid cancer 2009 status post thyroidectomy with postsurgical hypothyroidism, gastroesophageal reflux disease, iron deficiency anemia, intraductal papilloma of the right breast 2010 status post lumpectomy, DCIS of the left breast 2008 status post lumpectomy/radiation and multiple hospitalizations for recurrent suspected gastrointestinal bleeding (04/2021 and 11/2021) presenting to [facility] emergency department at the direction of her primary care provider due to low hemoglobin. ? ?Patient has a significant history of iron deficiency anemia since June of 2022 previously requiring 2 units of pRBCs and iron infusion. Previous work-up for anemia as seen below.  ? ?She reports increasing episodes of lightheadedness for the last 3-4 days. She also experienced weakness, heart palpitations, and shortness of breath with exertion. Due to her symptoms she presented to her PCP where her Hgb was found to be 6.6 and she was instructed to present to the ED. Upon presentation to the ED her Hgb was 6.1 with hemepositive stool.    ? ?EGD 05/08/2021 ?- Normal esophagus. ?- Z-line regular, 35 cm from the incisors. ?- Non-bleeding gastric ulcer. Biopsied negative for H. Pylori.  ?- Normal examined duodenum. ? ?Colonoscopy 6/13/12022 ?- Diverticulosis in the sigmoid colon and in the descending colon. ?- The examined portion of the ileum was normal. ?- The distal rectum and anal verge are normal on retroflexion view. ?- No specimens collected. ? ?Capsule Endoscopy 12/13/2021 ?Possible mass lesion in the mid Jejunum. ? ?Double Balloon Enteroscopy 01/19/2022 ?Multiple jejunal diverticula with no stigmata of hemorrhage  ?Otherwise normal double balloon  enteroscopy  ?Plan to repeat capsule endoscopy to confirm there was not a lesion that could not be reached during procedure.  ?Pill endoscopy planned for 03/31/2022 ? ?Patient denies NSAID use, alcohol, smoking, family history of colon cancer.  Denies family history of anemia.  Denies blood thinner use.  She ? ?She denies nausea, vomiting, constipation, diarrhea, melena, hematochezia. ?Past Medical History:  ?Diagnosis Date  ? Cancer [facility])   ? breast and thyroid  ? Personal history of radiation therapy   ? Thyroid disease   ? Von Willebrand disease ([place])   ? ? ?Past Surgical History:  ?Procedure Laterality Date  ? ABDOMINAL HYSTERECTOMY    ? BIOPSY  05/08/2021  ? Procedure: BIOPSY;  Surgeon: Ronnette Juniper, MD;  Location: [facility];  Service: Gastroenterology;;  ? BREAST LUMPECTOMY Left 2008  ? BREAST LUMPECTOMY Right 2010  ? BREAST SURGERY    ? cesearian    ? COLONOSCOPY WITH PROPOFOL N/A 05/08/2021  ? Procedure: COLONOSCOPY WITH PROPOFOL;  Surgeon: Ronnette Juniper, MD;  Location: [facility];  Service: Gastroenterology;  Laterality: N/A;  Might need postprocedural DDAVP; hematology consultant will arrange  ? ESOPHAGOGASTRODUODENOSCOPY (EGD) WITH PROPOFOL N/A 05/08/2021  ? Procedure: ESOPHAGOGASTRODUODENOSCOPY (EGD) WITH PROPOFOL;  Surgeon: Ronnette Juniper, MD;  Location: [facility];  Service: Gastroenterology;  Laterality: N/A;  might need postprocedural DDAVP; Hematology Consultant to arrange  ? THYROIDECTOMY    ? ? ?Prior to Admission medications   ?Medication Sig Start Date End Date Taking? Authorizing Provider  ?levothyroxine (SYNTHROID, LEVOTHROID) 125 MCG tablet Take 125 mcg by mouth daily before breakfast.    [provider]  ?pantoprazole (PROTONIX) 40 MG tablet Take 1 tablet (40 mg total) by mouth daily. 12/07/21 02/05/22  Mercy Riding, MD  ? ? ?  Scheduled Meds: ? levothyroxine  125 mcg Oral Q0600  ? pantoprazole (PROTONIX) IV  40 mg Intravenous Q12H  ? sodium chloride flush  3 mL Intravenous Q12H   ? ?Continuous Infusions: ?PRN Meds:.acetaminophen **OR** acetaminophen, ondansetron **OR** ondansetron (ZOFRAN) IV, polyethylene glycol ? ?Allergies as of 03/19/2022 - Review Complete 03/19/2022  ?Allergen Reaction Noted  ? Iron  11/13/2021  ? ? ?History reviewed. No pertinent family history. ? ?Social History  ? ?Socioeconomic History  ? Marital status: Married  ?  Spouse name: Not on file  ? Number of children: Not on file  ? Years of education: Not on file  ? Highest education level: Not on file  ?Occupational History  ? Not on file  ?Tobacco Use  ? Smoking status: Former  ?  Types: Cigarettes  ?  Quit date: 4  ?  Years since quitting: 46.3  ? Smokeless tobacco: Never  ? Tobacco comments:  ?  Quit 40 years ago  ?Vaping Use  ? Vaping Use: Never used  ?Substance and Sexual Activity  ? Alcohol use: Yes  ?  Alcohol/week: 5.0 - 7.0 standard drinks  ?  Types: 5 - 7 Standard drinks or equivalent per week  ?  Comment: "I have about 1 cocktail a night"  ? Drug use: No  ? Sexual activity: Yes  ?  Birth control/protection: None  ?Other Topics Concern  ? Not on file  ?Social History Narrative  ? Not on file  ? ?Social Determinants of Health  ? ?Financial Resource Strain: Not on file  ?Food Insecurity: Not on file  ?Transportation Needs: Not on file  ?Physical Activity: Not on file  ?Stress: Not on file  ?Social Connections: Not on file  ?Intimate Partner Violence: Not on file  ? ? ?Review of Systems:Review of Systems  ?Constitutional:  Positive for malaise/fatigue. Negative for chills and fever.  ?HENT:  Negative for hearing loss and tinnitus.   ?Eyes:  Negative for blurred vision and double vision.  ?Respiratory:  Negative for cough and sputum production.   ?Cardiovascular:  Positive for palpitations. Negative for chest pain.  ?Gastrointestinal:  Negative for abdominal pain, blood in stool, constipation, diarrhea, heartburn, melena, nausea and vomiting.  ?Genitourinary:  Negative for dysuria and urgency.   ?Musculoskeletal:  Negative for myalgias and neck pain.  ?Skin:  Negative for itching and rash.  ?Neurological:  Positive for weakness. Negative for dizziness.  ?Endo/Heme/Allergies:  Negative for environmental allergies. Bruises/bleeds easily.  ?Psychiatric/Behavioral:  Negative for depression and substance abuse.   ? ? ?Physical Exam:Physical Exam ?Constitutional:   ?   General: She is not in acute distress. ?   Appearance: Normal appearance. She is normal weight.  ?HENT:  ?   Head: Normocephalic and atraumatic.  ?   Right Ear: External ear normal.  ?   Left Ear: External ear normal.  ?   Nose: Nose normal.  ?   Mouth/Throat:  ?   Mouth: Mucous membranes are moist.  ?Eyes:  ?   Pupils: Pupils are equal, round, and reactive to light.  ?   Comments: Conjunctival pallor  ?Cardiovascular:  ?   Rate and Rhythm: Normal rate and regular rhythm.  ?   Pulses: Normal pulses.  ?   Heart sounds: Murmur heard.  ?Pulmonary:  ?   Effort: Pulmonary effort is normal.  ?   Breath sounds: Normal breath sounds.  ?Abdominal:  ?   General: Abdomen is flat. Bowel sounds are normal. There is no distension.  ?  Palpations: Abdomen is soft. There is no mass.  ?   Tenderness: There is no abdominal tenderness. There is no guarding or rebound.  ?   Hernia: No hernia is present.  ?Musculoskeletal:     ?   General: No swelling. Normal range of motion.  ?   Cervical back: Normal range of motion and neck supple.  ?Skin: ?   General: Skin is warm and dry.  ?   Coloration: Skin is pale.  ?Neurological:  ?   General: No focal deficit present.  ?   Mental Status: She is alert and oriented to person, place, and time.  ?Psychiatric:     ?   Mood and Affect: Mood normal.     ?   Behavior: Behavior normal.  ? ? ?Vital signs: ?Vitals:  ? 03/20/22 0311 03/20/22 0409  ?BP: (!) 106/51 126/75  ?Pulse: 80 77  ?Resp: 15 16  ?Temp: 98.2 ?F (36.8 ?C) 98.7 ?F (37.1 ?C)  ?SpO2: 98% 97%  ? ?  ? ? ? ?GI:  ?Lab Results: ?Recent Labs  ?  03/19/22 ?1710 03/20/22 ?0559   ?WBC 4.5 4.9  ?HGB 6.1* 7.4*  ?HCT 20.9* 24.0*  ?PLT 313 233  ? ?BMET ?Recent Labs  ?  03/19/22 ?1710  ?NA 141  ?K 3.3*  ?CL 113*  ?CO2 21*  ?GLUCOSE 117*  ?BUN 21  ?CREATININE 0.76  ?CALCIUM 8.9  ? ?LFT ?Recent Labs  ?  04/

## 2022-03-21 LAB — TYPE AND SCREEN
ABO/RH(D): O POS
Antibody Screen: NEGATIVE
Unit division: 0
Unit division: 0

## 2022-03-21 LAB — BPAM RBC
Blood Product Expiration Date: 202305242359
Blood Product Expiration Date: 202305242359
ISSUE DATE / TIME: 202304242228
ISSUE DATE / TIME: 202304250050
Unit Type and Rh: 5100
Unit Type and Rh: 5100

## 2022-03-25 DIAGNOSIS — E039 Hypothyroidism, unspecified: Secondary | ICD-10-CM | POA: Diagnosis not present

## 2022-03-25 DIAGNOSIS — E559 Vitamin D deficiency, unspecified: Secondary | ICD-10-CM | POA: Diagnosis not present

## 2022-03-25 DIAGNOSIS — E785 Hyperlipidemia, unspecified: Secondary | ICD-10-CM | POA: Diagnosis not present

## 2022-03-25 DIAGNOSIS — M810 Age-related osteoporosis without current pathological fracture: Secondary | ICD-10-CM | POA: Diagnosis not present

## 2022-03-26 DIAGNOSIS — D6800 Von Willebrand disease, unspecified: Secondary | ICD-10-CM | POA: Diagnosis not present

## 2022-03-26 DIAGNOSIS — D72819 Decreased white blood cell count, unspecified: Secondary | ICD-10-CM | POA: Diagnosis not present

## 2022-03-26 DIAGNOSIS — D500 Iron deficiency anemia secondary to blood loss (chronic): Secondary | ICD-10-CM | POA: Diagnosis not present

## 2022-03-26 DIAGNOSIS — D6801 Von willebrand disease, type 1: Secondary | ICD-10-CM | POA: Diagnosis not present

## 2022-03-26 DIAGNOSIS — R011 Cardiac murmur, unspecified: Secondary | ICD-10-CM | POA: Diagnosis not present

## 2022-03-30 DIAGNOSIS — D500 Iron deficiency anemia secondary to blood loss (chronic): Secondary | ICD-10-CM | POA: Diagnosis not present

## 2022-04-02 DIAGNOSIS — D500 Iron deficiency anemia secondary to blood loss (chronic): Secondary | ICD-10-CM | POA: Diagnosis not present

## 2022-04-02 DIAGNOSIS — D6800 Von Willebrand disease, unspecified: Secondary | ICD-10-CM | POA: Diagnosis not present

## 2022-04-04 DIAGNOSIS — D500 Iron deficiency anemia secondary to blood loss (chronic): Secondary | ICD-10-CM | POA: Diagnosis not present

## 2022-04-06 DIAGNOSIS — R350 Frequency of micturition: Secondary | ICD-10-CM | POA: Diagnosis not present

## 2022-04-06 DIAGNOSIS — M545 Low back pain, unspecified: Secondary | ICD-10-CM | POA: Diagnosis not present

## 2022-04-09 DIAGNOSIS — D500 Iron deficiency anemia secondary to blood loss (chronic): Secondary | ICD-10-CM | POA: Diagnosis not present

## 2022-04-09 DIAGNOSIS — K922 Gastrointestinal hemorrhage, unspecified: Secondary | ICD-10-CM | POA: Diagnosis not present

## 2022-04-09 DIAGNOSIS — D6800 Von Willebrand disease, unspecified: Secondary | ICD-10-CM | POA: Diagnosis not present

## 2022-04-09 DIAGNOSIS — Z8719 Personal history of other diseases of the digestive system: Secondary | ICD-10-CM | POA: Diagnosis not present

## 2022-04-12 DIAGNOSIS — K922 Gastrointestinal hemorrhage, unspecified: Secondary | ICD-10-CM | POA: Diagnosis not present

## 2022-04-12 DIAGNOSIS — D6800 Von Willebrand disease, unspecified: Secondary | ICD-10-CM | POA: Diagnosis not present

## 2022-04-12 DIAGNOSIS — R011 Cardiac murmur, unspecified: Secondary | ICD-10-CM | POA: Diagnosis not present

## 2022-04-16 DIAGNOSIS — K922 Gastrointestinal hemorrhage, unspecified: Secondary | ICD-10-CM | POA: Diagnosis not present

## 2022-04-16 DIAGNOSIS — D500 Iron deficiency anemia secondary to blood loss (chronic): Secondary | ICD-10-CM | POA: Diagnosis not present

## 2022-04-16 DIAGNOSIS — D6800 Von Willebrand disease, unspecified: Secondary | ICD-10-CM | POA: Diagnosis not present

## 2022-04-18 DIAGNOSIS — D66 Hereditary factor VIII deficiency: Secondary | ICD-10-CM | POA: Diagnosis not present

## 2022-04-26 DIAGNOSIS — K921 Melena: Secondary | ICD-10-CM | POA: Diagnosis not present

## 2022-04-26 DIAGNOSIS — K922 Gastrointestinal hemorrhage, unspecified: Secondary | ICD-10-CM | POA: Diagnosis not present

## 2022-04-26 DIAGNOSIS — D6800 Von Willebrand disease, unspecified: Secondary | ICD-10-CM | POA: Diagnosis not present

## 2022-04-26 DIAGNOSIS — D500 Iron deficiency anemia secondary to blood loss (chronic): Secondary | ICD-10-CM | POA: Diagnosis not present

## 2022-04-30 DIAGNOSIS — I358 Other nonrheumatic aortic valve disorders: Secondary | ICD-10-CM | POA: Diagnosis not present

## 2022-04-30 DIAGNOSIS — D500 Iron deficiency anemia secondary to blood loss (chronic): Secondary | ICD-10-CM | POA: Diagnosis not present

## 2022-04-30 DIAGNOSIS — D6801 Von willebrand disease, type 1: Secondary | ICD-10-CM | POA: Diagnosis not present

## 2022-04-30 DIAGNOSIS — R011 Cardiac murmur, unspecified: Secondary | ICD-10-CM | POA: Diagnosis not present

## 2022-04-30 DIAGNOSIS — Z8489 Family history of other specified conditions: Secondary | ICD-10-CM | POA: Diagnosis not present

## 2022-04-30 DIAGNOSIS — K921 Melena: Secondary | ICD-10-CM | POA: Diagnosis not present

## 2022-05-01 DIAGNOSIS — I083 Combined rheumatic disorders of mitral, aortic and tricuspid valves: Secondary | ICD-10-CM | POA: Diagnosis not present

## 2022-05-01 DIAGNOSIS — I3481 Nonrheumatic mitral (valve) annulus calcification: Secondary | ICD-10-CM | POA: Diagnosis not present

## 2022-05-01 DIAGNOSIS — I371 Nonrheumatic pulmonary valve insufficiency: Secondary | ICD-10-CM | POA: Diagnosis not present

## 2022-05-01 DIAGNOSIS — I340 Nonrheumatic mitral (valve) insufficiency: Secondary | ICD-10-CM | POA: Diagnosis not present

## 2022-05-01 DIAGNOSIS — I071 Rheumatic tricuspid insufficiency: Secondary | ICD-10-CM | POA: Diagnosis not present

## 2022-05-01 DIAGNOSIS — R011 Cardiac murmur, unspecified: Secondary | ICD-10-CM | POA: Diagnosis not present

## 2022-05-03 DIAGNOSIS — K921 Melena: Secondary | ICD-10-CM | POA: Diagnosis not present

## 2022-05-03 DIAGNOSIS — D6800 Von Willebrand disease, unspecified: Secondary | ICD-10-CM | POA: Diagnosis not present

## 2022-05-07 DIAGNOSIS — D6800 Von Willebrand disease, unspecified: Secondary | ICD-10-CM | POA: Diagnosis not present

## 2022-05-07 DIAGNOSIS — K921 Melena: Secondary | ICD-10-CM | POA: Diagnosis not present

## 2022-05-07 DIAGNOSIS — D500 Iron deficiency anemia secondary to blood loss (chronic): Secondary | ICD-10-CM | POA: Diagnosis not present

## 2022-05-08 DIAGNOSIS — R9431 Abnormal electrocardiogram [ECG] [EKG]: Secondary | ICD-10-CM | POA: Diagnosis not present

## 2022-05-08 DIAGNOSIS — D6800 Von Willebrand disease, unspecified: Secondary | ICD-10-CM | POA: Diagnosis not present

## 2022-05-08 DIAGNOSIS — R011 Cardiac murmur, unspecified: Secondary | ICD-10-CM | POA: Diagnosis not present

## 2022-05-08 DIAGNOSIS — I350 Nonrheumatic aortic (valve) stenosis: Secondary | ICD-10-CM | POA: Diagnosis not present

## 2022-05-10 DIAGNOSIS — D500 Iron deficiency anemia secondary to blood loss (chronic): Secondary | ICD-10-CM | POA: Diagnosis not present

## 2022-05-10 DIAGNOSIS — D6800 Von Willebrand disease, unspecified: Secondary | ICD-10-CM | POA: Diagnosis not present

## 2022-05-10 DIAGNOSIS — K921 Melena: Secondary | ICD-10-CM | POA: Diagnosis not present

## 2022-05-14 DIAGNOSIS — I2510 Atherosclerotic heart disease of native coronary artery without angina pectoris: Secondary | ICD-10-CM | POA: Diagnosis not present

## 2022-05-14 DIAGNOSIS — I350 Nonrheumatic aortic (valve) stenosis: Secondary | ICD-10-CM | POA: Diagnosis not present

## 2022-05-18 DIAGNOSIS — D66 Hereditary factor VIII deficiency: Secondary | ICD-10-CM | POA: Diagnosis not present

## 2022-05-23 DIAGNOSIS — D6800 Von Willebrand disease, unspecified: Secondary | ICD-10-CM | POA: Diagnosis not present

## 2022-05-23 DIAGNOSIS — D500 Iron deficiency anemia secondary to blood loss (chronic): Secondary | ICD-10-CM | POA: Diagnosis not present

## 2022-05-24 DIAGNOSIS — I350 Nonrheumatic aortic (valve) stenosis: Secondary | ICD-10-CM | POA: Diagnosis not present

## 2022-05-24 DIAGNOSIS — Q2739 Arteriovenous malformation, other site: Secondary | ICD-10-CM | POA: Diagnosis not present

## 2022-05-24 DIAGNOSIS — K921 Melena: Secondary | ICD-10-CM | POA: Diagnosis not present

## 2022-05-24 DIAGNOSIS — D6800 Von Willebrand disease, unspecified: Secondary | ICD-10-CM | POA: Diagnosis not present

## 2022-06-19 DIAGNOSIS — D66 Hereditary factor VIII deficiency: Secondary | ICD-10-CM | POA: Diagnosis not present

## 2022-06-25 DIAGNOSIS — D500 Iron deficiency anemia secondary to blood loss (chronic): Secondary | ICD-10-CM | POA: Diagnosis not present

## 2022-07-06 DIAGNOSIS — D500 Iron deficiency anemia secondary to blood loss (chronic): Secondary | ICD-10-CM | POA: Diagnosis not present

## 2022-07-06 DIAGNOSIS — E039 Hypothyroidism, unspecified: Secondary | ICD-10-CM | POA: Diagnosis not present

## 2022-07-06 DIAGNOSIS — R011 Cardiac murmur, unspecified: Secondary | ICD-10-CM | POA: Diagnosis not present

## 2022-07-06 DIAGNOSIS — E785 Hyperlipidemia, unspecified: Secondary | ICD-10-CM | POA: Diagnosis not present

## 2022-07-06 DIAGNOSIS — Z0000 Encounter for general adult medical examination without abnormal findings: Secondary | ICD-10-CM | POA: Diagnosis not present

## 2022-07-11 DIAGNOSIS — I350 Nonrheumatic aortic (valve) stenosis: Secondary | ICD-10-CM | POA: Diagnosis not present

## 2022-07-11 DIAGNOSIS — D6800 Von Willebrand disease, unspecified: Secondary | ICD-10-CM | POA: Diagnosis not present

## 2022-07-13 ENCOUNTER — Other Ambulatory Visit: Payer: Self-pay | Admitting: Internal Medicine

## 2022-07-13 DIAGNOSIS — Z0000 Encounter for general adult medical examination without abnormal findings: Secondary | ICD-10-CM | POA: Diagnosis not present

## 2022-07-13 DIAGNOSIS — E782 Mixed hyperlipidemia: Secondary | ICD-10-CM | POA: Diagnosis not present

## 2022-07-13 DIAGNOSIS — K922 Gastrointestinal hemorrhage, unspecified: Secondary | ICD-10-CM | POA: Diagnosis not present

## 2022-07-13 DIAGNOSIS — D6800 Von Willebrand disease, unspecified: Secondary | ICD-10-CM | POA: Diagnosis not present

## 2022-07-13 DIAGNOSIS — I350 Nonrheumatic aortic (valve) stenosis: Secondary | ICD-10-CM | POA: Diagnosis not present

## 2022-07-13 DIAGNOSIS — N63 Unspecified lump in unspecified breast: Secondary | ICD-10-CM

## 2022-07-13 DIAGNOSIS — E890 Postprocedural hypothyroidism: Secondary | ICD-10-CM | POA: Diagnosis not present

## 2022-07-16 DIAGNOSIS — D500 Iron deficiency anemia secondary to blood loss (chronic): Secondary | ICD-10-CM | POA: Diagnosis not present

## 2022-07-16 DIAGNOSIS — K449 Diaphragmatic hernia without obstruction or gangrene: Secondary | ICD-10-CM | POA: Diagnosis not present

## 2022-07-16 DIAGNOSIS — K5710 Diverticulosis of small intestine without perforation or abscess without bleeding: Secondary | ICD-10-CM | POA: Diagnosis not present

## 2022-07-17 DIAGNOSIS — C73 Malignant neoplasm of thyroid gland: Secondary | ICD-10-CM | POA: Diagnosis not present

## 2022-07-17 DIAGNOSIS — E890 Postprocedural hypothyroidism: Secondary | ICD-10-CM | POA: Diagnosis not present

## 2022-07-23 DIAGNOSIS — D500 Iron deficiency anemia secondary to blood loss (chronic): Secondary | ICD-10-CM | POA: Diagnosis not present

## 2022-07-23 DIAGNOSIS — D6800 Von Willebrand disease, unspecified: Secondary | ICD-10-CM | POA: Diagnosis not present

## 2022-07-26 DIAGNOSIS — D500 Iron deficiency anemia secondary to blood loss (chronic): Secondary | ICD-10-CM | POA: Diagnosis not present

## 2022-07-26 DIAGNOSIS — D6800 Von Willebrand disease, unspecified: Secondary | ICD-10-CM | POA: Diagnosis not present

## 2022-07-26 DIAGNOSIS — K921 Melena: Secondary | ICD-10-CM | POA: Diagnosis not present

## 2022-07-27 DIAGNOSIS — D66 Hereditary factor VIII deficiency: Secondary | ICD-10-CM | POA: Diagnosis not present

## 2022-08-01 DIAGNOSIS — D6800 Von Willebrand disease, unspecified: Secondary | ICD-10-CM | POA: Diagnosis not present

## 2022-08-01 DIAGNOSIS — D66 Hereditary factor VIII deficiency: Secondary | ICD-10-CM | POA: Diagnosis not present

## 2022-08-02 DIAGNOSIS — D500 Iron deficiency anemia secondary to blood loss (chronic): Secondary | ICD-10-CM | POA: Diagnosis not present

## 2022-08-02 DIAGNOSIS — D6800 Von Willebrand disease, unspecified: Secondary | ICD-10-CM | POA: Diagnosis not present

## 2022-08-14 DIAGNOSIS — Z87891 Personal history of nicotine dependence: Secondary | ICD-10-CM | POA: Diagnosis not present

## 2022-08-14 DIAGNOSIS — D649 Anemia, unspecified: Secondary | ICD-10-CM | POA: Diagnosis not present

## 2022-08-14 DIAGNOSIS — D6800 Von Willebrand disease, unspecified: Secondary | ICD-10-CM | POA: Diagnosis not present

## 2022-08-14 DIAGNOSIS — I350 Nonrheumatic aortic (valve) stenosis: Secondary | ICD-10-CM | POA: Diagnosis not present

## 2022-08-15 ENCOUNTER — Ambulatory Visit: Payer: PPO

## 2022-08-15 ENCOUNTER — Ambulatory Visit
Admission: RE | Admit: 2022-08-15 | Discharge: 2022-08-15 | Disposition: A | Discharge status: Home / Self Care | Payer: PPO | Source: Ambulatory Visit | Attending: Internal Medicine | Admitting: Internal Medicine

## 2022-08-15 DIAGNOSIS — Z853 Personal history of malignant neoplasm of breast: Secondary | ICD-10-CM | POA: Diagnosis not present

## 2022-08-15 DIAGNOSIS — N63 Unspecified lump in unspecified breast: Secondary | ICD-10-CM

## 2022-08-31 DIAGNOSIS — D66 Hereditary factor VIII deficiency: Secondary | ICD-10-CM | POA: Diagnosis not present

## 2022-09-05 DIAGNOSIS — F418 Other specified anxiety disorders: Secondary | ICD-10-CM | POA: Diagnosis not present

## 2022-09-05 DIAGNOSIS — E039 Hypothyroidism, unspecified: Secondary | ICD-10-CM | POA: Diagnosis not present

## 2022-09-05 DIAGNOSIS — D6800 Von Willebrand disease, unspecified: Secondary | ICD-10-CM | POA: Diagnosis not present

## 2022-09-05 DIAGNOSIS — I350 Nonrheumatic aortic (valve) stenosis: Secondary | ICD-10-CM | POA: Diagnosis not present

## 2022-09-05 DIAGNOSIS — K219 Gastro-esophageal reflux disease without esophagitis: Secondary | ICD-10-CM | POA: Diagnosis not present

## 2022-09-05 DIAGNOSIS — Z01812 Encounter for preprocedural laboratory examination: Secondary | ICD-10-CM | POA: Diagnosis not present

## 2022-09-05 DIAGNOSIS — E785 Hyperlipidemia, unspecified: Secondary | ICD-10-CM | POA: Diagnosis not present

## 2022-09-05 DIAGNOSIS — D649 Anemia, unspecified: Secondary | ICD-10-CM | POA: Diagnosis not present

## 2022-09-12 DIAGNOSIS — F32A Depression, unspecified: Secondary | ICD-10-CM | POA: Diagnosis not present

## 2022-09-12 DIAGNOSIS — Z888 Allergy status to other drugs, medicaments and biological substances status: Secondary | ICD-10-CM | POA: Diagnosis not present

## 2022-09-12 DIAGNOSIS — D649 Anemia, unspecified: Secondary | ICD-10-CM | POA: Diagnosis not present

## 2022-09-12 DIAGNOSIS — Z87891 Personal history of nicotine dependence: Secondary | ICD-10-CM | POA: Diagnosis not present

## 2022-09-12 DIAGNOSIS — E785 Hyperlipidemia, unspecified: Secondary | ICD-10-CM | POA: Diagnosis not present

## 2022-09-12 DIAGNOSIS — M810 Age-related osteoporosis without current pathological fracture: Secondary | ICD-10-CM | POA: Diagnosis not present

## 2022-09-12 DIAGNOSIS — Z923 Personal history of irradiation: Secondary | ICD-10-CM | POA: Diagnosis not present

## 2022-09-12 DIAGNOSIS — I352 Nonrheumatic aortic (valve) stenosis with insufficiency: Secondary | ICD-10-CM | POA: Diagnosis not present

## 2022-09-12 DIAGNOSIS — Z006 Encounter for examination for normal comparison and control in clinical research program: Secondary | ICD-10-CM | POA: Diagnosis not present

## 2022-09-12 DIAGNOSIS — Z8719 Personal history of other diseases of the digestive system: Secondary | ICD-10-CM | POA: Diagnosis not present

## 2022-09-12 DIAGNOSIS — I5032 Chronic diastolic (congestive) heart failure: Secondary | ICD-10-CM | POA: Diagnosis not present

## 2022-09-12 DIAGNOSIS — D6801 Von willebrand disease, type 1: Secondary | ICD-10-CM | POA: Diagnosis not present

## 2022-09-12 DIAGNOSIS — I517 Cardiomegaly: Secondary | ICD-10-CM | POA: Diagnosis not present

## 2022-09-12 DIAGNOSIS — Z853 Personal history of malignant neoplasm of breast: Secondary | ICD-10-CM | POA: Diagnosis not present

## 2022-09-12 DIAGNOSIS — K767 Hepatorenal syndrome: Secondary | ICD-10-CM | POA: Diagnosis not present

## 2022-09-12 DIAGNOSIS — K219 Gastro-esophageal reflux disease without esophagitis: Secondary | ICD-10-CM | POA: Diagnosis not present

## 2022-09-12 DIAGNOSIS — Z7982 Long term (current) use of aspirin: Secondary | ICD-10-CM | POA: Diagnosis not present

## 2022-09-12 DIAGNOSIS — K922 Gastrointestinal hemorrhage, unspecified: Secondary | ICD-10-CM | POA: Diagnosis not present

## 2022-09-12 DIAGNOSIS — I447 Left bundle-branch block, unspecified: Secondary | ICD-10-CM | POA: Diagnosis not present

## 2022-09-12 DIAGNOSIS — I350 Nonrheumatic aortic (valve) stenosis: Secondary | ICD-10-CM | POA: Diagnosis not present

## 2022-09-12 DIAGNOSIS — Z952 Presence of prosthetic heart valve: Secondary | ICD-10-CM | POA: Diagnosis not present

## 2022-09-12 DIAGNOSIS — Z85850 Personal history of malignant neoplasm of thyroid: Secondary | ICD-10-CM | POA: Diagnosis not present

## 2022-09-12 DIAGNOSIS — E039 Hypothyroidism, unspecified: Secondary | ICD-10-CM | POA: Diagnosis not present

## 2022-09-12 DIAGNOSIS — I4581 Long QT syndrome: Secondary | ICD-10-CM | POA: Diagnosis not present

## 2022-09-12 DIAGNOSIS — F419 Anxiety disorder, unspecified: Secondary | ICD-10-CM | POA: Diagnosis not present

## 2022-09-12 HISTORY — PX: TRANSCATHETER AORTIC VALVE REPLACEMENT, TRANSAORTIC: SHX6402

## 2022-09-15 DIAGNOSIS — I498 Other specified cardiac arrhythmias: Secondary | ICD-10-CM | POA: Diagnosis not present

## 2022-09-15 DIAGNOSIS — I517 Cardiomegaly: Secondary | ICD-10-CM | POA: Diagnosis not present

## 2022-09-15 DIAGNOSIS — Z952 Presence of prosthetic heart valve: Secondary | ICD-10-CM | POA: Diagnosis not present

## 2022-09-19 DIAGNOSIS — D62 Acute posthemorrhagic anemia: Secondary | ICD-10-CM | POA: Diagnosis not present

## 2022-09-19 DIAGNOSIS — D6800 Von Willebrand disease, unspecified: Secondary | ICD-10-CM | POA: Diagnosis not present

## 2022-09-25 DIAGNOSIS — D6801 Von willebrand disease, type 1: Secondary | ICD-10-CM | POA: Diagnosis not present

## 2022-09-25 DIAGNOSIS — D500 Iron deficiency anemia secondary to blood loss (chronic): Secondary | ICD-10-CM | POA: Diagnosis not present

## 2022-10-04 DIAGNOSIS — D500 Iron deficiency anemia secondary to blood loss (chronic): Secondary | ICD-10-CM | POA: Diagnosis not present

## 2022-10-04 DIAGNOSIS — D6800 Von Willebrand disease, unspecified: Secondary | ICD-10-CM | POA: Diagnosis not present

## 2022-10-04 DIAGNOSIS — D62 Acute posthemorrhagic anemia: Secondary | ICD-10-CM | POA: Diagnosis not present

## 2022-10-05 ENCOUNTER — Telehealth (HOSPITAL_COMMUNITY): Payer: Self-pay | Admitting: Internal Medicine

## 2022-10-08 ENCOUNTER — Telehealth (HOSPITAL_COMMUNITY): Payer: Self-pay

## 2022-10-08 NOTE — Telephone Encounter (Signed)
Outside/paper referral received by Dr. Maylene Roes from [facility]. Will fax over Physician order and request further documents. Insurance benefits and eligibility to be determined.

## 2022-10-08 NOTE — Telephone Encounter (Signed)
Called patient to see if she is interested in the Cardiac Rehab Program. Patient expressed interest. Explained scheduling process, patient verbalized understanding.  Will pass to RN for review.

## 2022-10-11 DIAGNOSIS — I471 Supraventricular tachycardia, unspecified: Secondary | ICD-10-CM | POA: Diagnosis not present

## 2022-10-23 DIAGNOSIS — I517 Cardiomegaly: Secondary | ICD-10-CM | POA: Diagnosis not present

## 2022-10-23 DIAGNOSIS — Z952 Presence of prosthetic heart valve: Secondary | ICD-10-CM | POA: Diagnosis not present

## 2022-10-23 DIAGNOSIS — I340 Nonrheumatic mitral (valve) insufficiency: Secondary | ICD-10-CM | POA: Diagnosis not present

## 2022-10-24 DIAGNOSIS — E890 Postprocedural hypothyroidism: Secondary | ICD-10-CM | POA: Diagnosis not present

## 2022-10-24 DIAGNOSIS — D6800 Von Willebrand disease, unspecified: Secondary | ICD-10-CM | POA: Diagnosis not present

## 2022-10-24 DIAGNOSIS — D509 Iron deficiency anemia, unspecified: Secondary | ICD-10-CM | POA: Diagnosis not present

## 2022-10-24 DIAGNOSIS — D6804 Acquired von Willebrand disease: Secondary | ICD-10-CM | POA: Diagnosis not present

## 2022-10-24 DIAGNOSIS — I350 Nonrheumatic aortic (valve) stenosis: Secondary | ICD-10-CM | POA: Diagnosis not present

## 2022-10-24 DIAGNOSIS — E782 Mixed hyperlipidemia: Secondary | ICD-10-CM | POA: Diagnosis not present

## 2022-10-30 DIAGNOSIS — Z952 Presence of prosthetic heart valve: Secondary | ICD-10-CM | POA: Diagnosis not present

## 2022-10-30 DIAGNOSIS — D6800 Von Willebrand disease, unspecified: Secondary | ICD-10-CM | POA: Diagnosis not present

## 2022-10-30 DIAGNOSIS — Z7982 Long term (current) use of aspirin: Secondary | ICD-10-CM | POA: Diagnosis not present

## 2022-10-30 DIAGNOSIS — I350 Nonrheumatic aortic (valve) stenosis: Secondary | ICD-10-CM | POA: Diagnosis not present

## 2022-10-30 DIAGNOSIS — K922 Gastrointestinal hemorrhage, unspecified: Secondary | ICD-10-CM | POA: Diagnosis not present

## 2022-11-07 IMAGING — CR DG ABDOMEN 2V
3 series · 3 of 3 positions shown · non-contrast
Comparison: None.

CLINICAL DATA: foreigh bod in disgestive tract, initial encounter

EXAM:
ABDOMEN - 2 VIEW

[w abdomen upright]
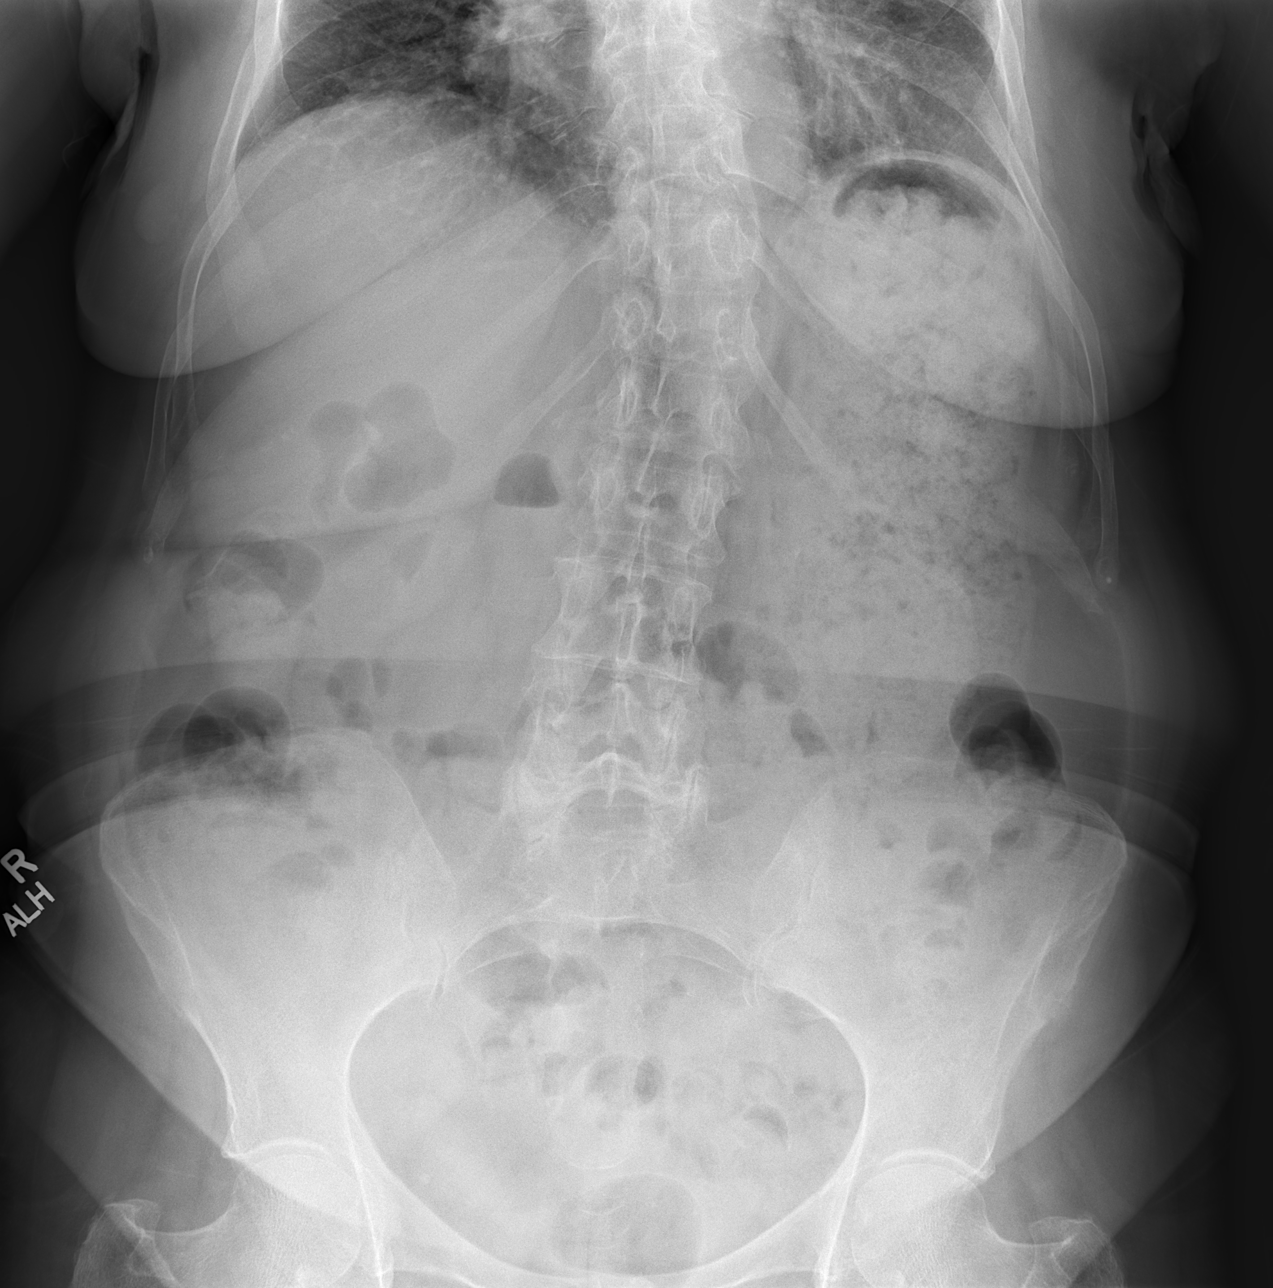

[t abdomen supine (1 of 2)]
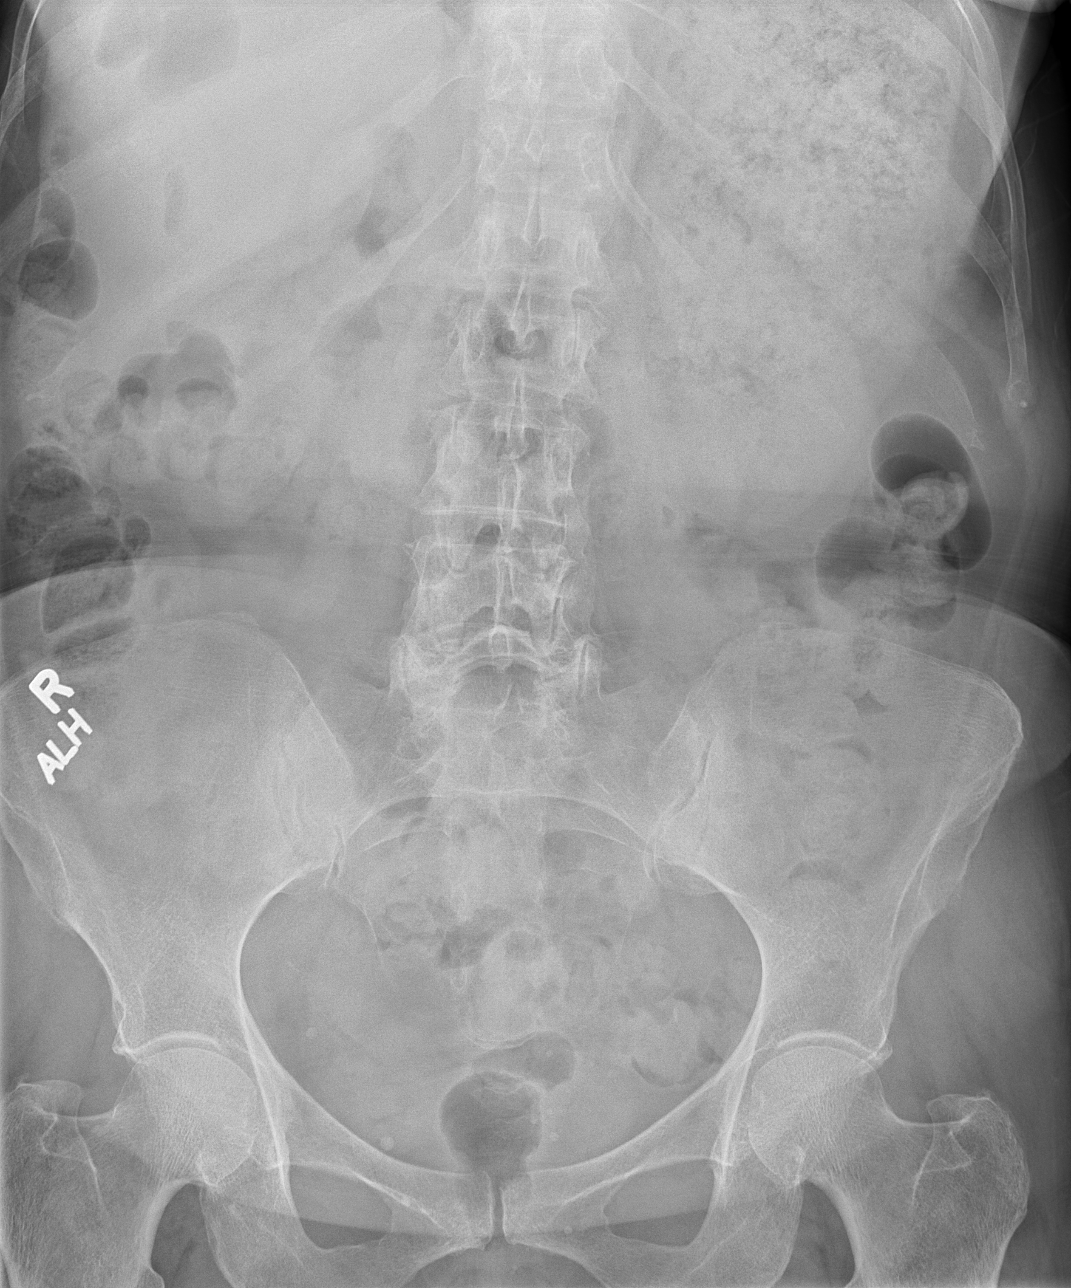

[t abdomen supine (2 of 2)]
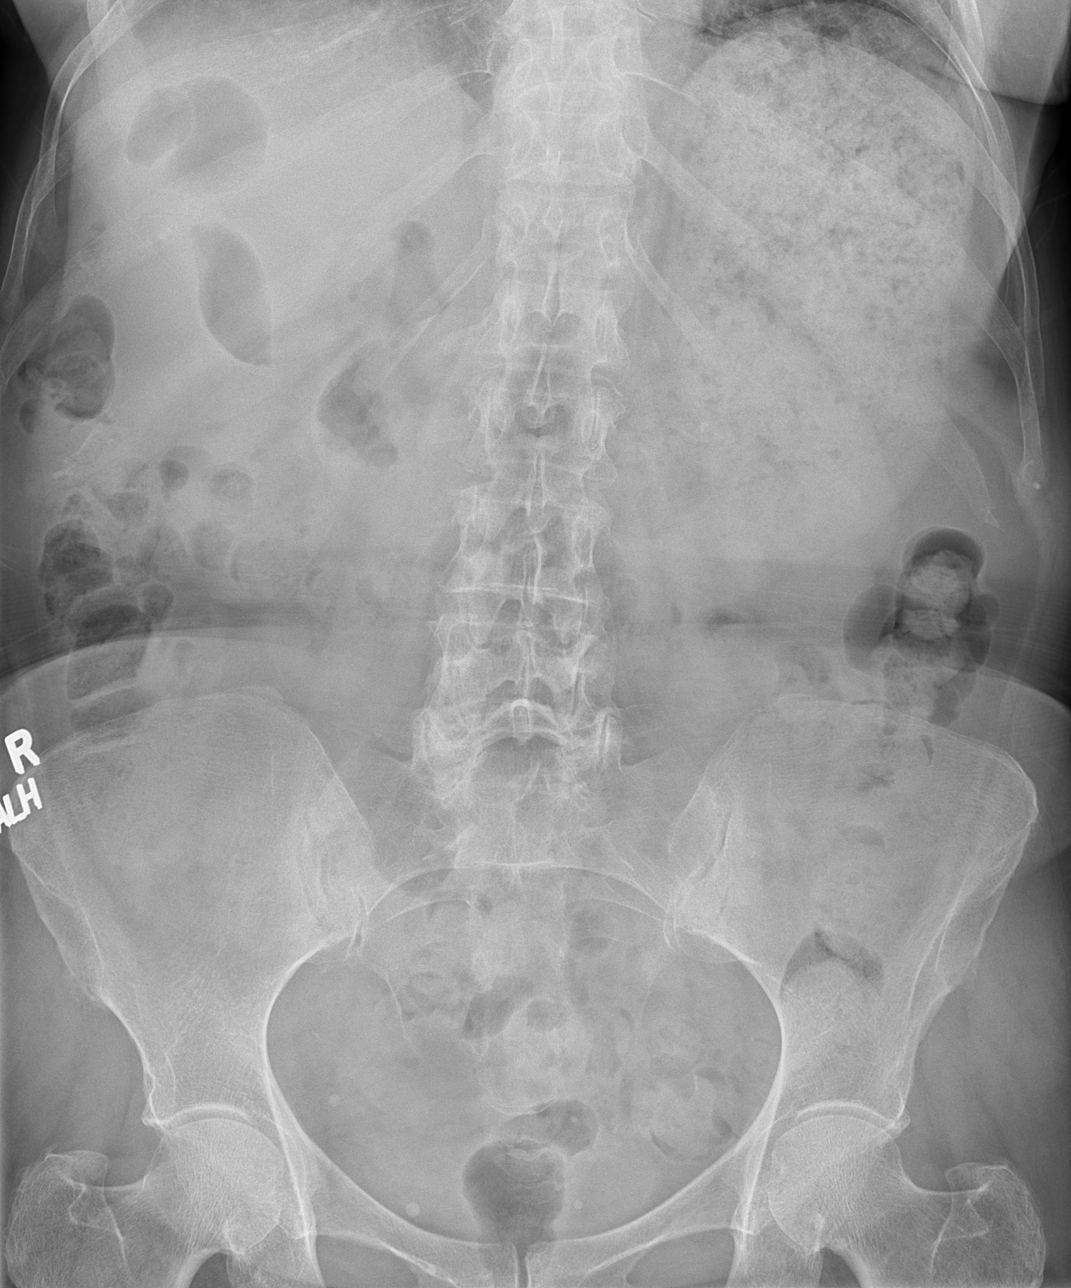

[3 of 3 positions shown; findings below may reference images not displayed]

FINDINGS: Air and stool-filled nondilated loops of bowel. Moderate colonic
stool burden predominately in the LEFT hemicolon. Visualized lung
bases are unremarkable. Degenerative changes of the lumbar spine. No
unexpected radiopaque foreign body.
IMPRESSION: No unexpected radiopaque foreign body visualized.

## 2022-11-14 DIAGNOSIS — D6800 Von Willebrand disease, unspecified: Secondary | ICD-10-CM | POA: Diagnosis not present

## 2022-11-16 DIAGNOSIS — D6804 Acquired von Willebrand disease: Secondary | ICD-10-CM | POA: Diagnosis not present

## 2022-11-16 DIAGNOSIS — D500 Iron deficiency anemia secondary to blood loss (chronic): Secondary | ICD-10-CM | POA: Diagnosis not present

## 2022-11-23 DIAGNOSIS — K921 Melena: Secondary | ICD-10-CM | POA: Diagnosis not present

## 2022-11-23 DIAGNOSIS — D500 Iron deficiency anemia secondary to blood loss (chronic): Secondary | ICD-10-CM | POA: Diagnosis not present

## 2022-11-23 DIAGNOSIS — D6800 Von Willebrand disease, unspecified: Secondary | ICD-10-CM | POA: Diagnosis not present

## 2022-11-28 DIAGNOSIS — D500 Iron deficiency anemia secondary to blood loss (chronic): Secondary | ICD-10-CM | POA: Diagnosis not present

## 2022-11-28 DIAGNOSIS — D6800 Von Willebrand disease, unspecified: Secondary | ICD-10-CM | POA: Diagnosis not present

## 2022-11-28 DIAGNOSIS — D62 Acute posthemorrhagic anemia: Secondary | ICD-10-CM | POA: Diagnosis not present

## 2022-12-06 DIAGNOSIS — I350 Nonrheumatic aortic (valve) stenosis: Secondary | ICD-10-CM | POA: Diagnosis not present

## 2022-12-06 DIAGNOSIS — H538 Other visual disturbances: Secondary | ICD-10-CM | POA: Diagnosis not present

## 2022-12-06 DIAGNOSIS — E785 Hyperlipidemia, unspecified: Secondary | ICD-10-CM | POA: Diagnosis not present

## 2022-12-06 DIAGNOSIS — R5383 Other fatigue: Secondary | ICD-10-CM | POA: Diagnosis not present

## 2022-12-06 DIAGNOSIS — Z952 Presence of prosthetic heart valve: Secondary | ICD-10-CM | POA: Diagnosis not present

## 2022-12-06 DIAGNOSIS — D6800 Von Willebrand disease, unspecified: Secondary | ICD-10-CM | POA: Diagnosis not present

## 2022-12-06 DIAGNOSIS — I10 Essential (primary) hypertension: Secondary | ICD-10-CM | POA: Diagnosis not present

## 2022-12-06 DIAGNOSIS — Z20822 Contact with and (suspected) exposure to covid-19: Secondary | ICD-10-CM | POA: Diagnosis not present

## 2022-12-06 DIAGNOSIS — K219 Gastro-esophageal reflux disease without esophagitis: Secondary | ICD-10-CM | POA: Diagnosis not present

## 2022-12-06 DIAGNOSIS — Z853 Personal history of malignant neoplasm of breast: Secondary | ICD-10-CM | POA: Diagnosis not present

## 2022-12-06 DIAGNOSIS — Z1152 Encounter for screening for COVID-19: Secondary | ICD-10-CM | POA: Diagnosis not present

## 2022-12-06 DIAGNOSIS — R072 Precordial pain: Secondary | ICD-10-CM | POA: Diagnosis not present

## 2022-12-06 DIAGNOSIS — H539 Unspecified visual disturbance: Secondary | ICD-10-CM | POA: Diagnosis not present

## 2022-12-06 DIAGNOSIS — Z85850 Personal history of malignant neoplasm of thyroid: Secondary | ICD-10-CM | POA: Diagnosis not present

## 2022-12-06 DIAGNOSIS — R519 Headache, unspecified: Secondary | ICD-10-CM | POA: Diagnosis not present

## 2022-12-06 DIAGNOSIS — Z87891 Personal history of nicotine dependence: Secondary | ICD-10-CM | POA: Diagnosis not present

## 2022-12-07 DIAGNOSIS — R9431 Abnormal electrocardiogram [ECG] [EKG]: Secondary | ICD-10-CM | POA: Diagnosis not present

## 2022-12-07 DIAGNOSIS — D6800 Von Willebrand disease, unspecified: Secondary | ICD-10-CM | POA: Diagnosis not present

## 2022-12-07 DIAGNOSIS — Z20822 Contact with and (suspected) exposure to covid-19: Secondary | ICD-10-CM | POA: Diagnosis not present

## 2022-12-07 DIAGNOSIS — R5383 Other fatigue: Secondary | ICD-10-CM | POA: Diagnosis not present

## 2022-12-07 DIAGNOSIS — I350 Nonrheumatic aortic (valve) stenosis: Secondary | ICD-10-CM | POA: Diagnosis not present

## 2022-12-07 DIAGNOSIS — H539 Unspecified visual disturbance: Secondary | ICD-10-CM | POA: Diagnosis not present

## 2022-12-07 DIAGNOSIS — R072 Precordial pain: Secondary | ICD-10-CM | POA: Diagnosis not present

## 2022-12-07 DIAGNOSIS — T8203XD Leakage of heart valve prosthesis, subsequent encounter: Secondary | ICD-10-CM | POA: Diagnosis not present

## 2022-12-07 DIAGNOSIS — Z87891 Personal history of nicotine dependence: Secondary | ICD-10-CM | POA: Diagnosis not present

## 2022-12-14 DIAGNOSIS — D500 Iron deficiency anemia secondary to blood loss (chronic): Secondary | ICD-10-CM | POA: Diagnosis not present

## 2022-12-14 DIAGNOSIS — D62 Acute posthemorrhagic anemia: Secondary | ICD-10-CM | POA: Diagnosis not present

## 2022-12-14 DIAGNOSIS — D6800 Von Willebrand disease, unspecified: Secondary | ICD-10-CM | POA: Diagnosis not present

## 2022-12-18 DIAGNOSIS — R079 Chest pain, unspecified: Secondary | ICD-10-CM | POA: Diagnosis not present

## 2022-12-24 ENCOUNTER — Telehealth (HOSPITAL_COMMUNITY): Payer: Self-pay

## 2022-12-24 NOTE — Telephone Encounter (Signed)
Pt called in regards to CR, adv pt we have not recv'ed signed MD order from her doctor office. And that the form had been faxed 3 time, also adv I will refax MD order again today. Pt stated she will f/u with her dr office.

## 2023-01-04 DIAGNOSIS — D500 Iron deficiency anemia secondary to blood loss (chronic): Secondary | ICD-10-CM | POA: Diagnosis not present

## 2023-01-04 DIAGNOSIS — D6800 Von Willebrand disease, unspecified: Secondary | ICD-10-CM | POA: Diagnosis not present

## 2023-01-07 DIAGNOSIS — D6800 Von Willebrand disease, unspecified: Secondary | ICD-10-CM | POA: Diagnosis not present

## 2023-01-07 DIAGNOSIS — K921 Melena: Secondary | ICD-10-CM | POA: Diagnosis not present

## 2023-01-07 DIAGNOSIS — D500 Iron deficiency anemia secondary to blood loss (chronic): Secondary | ICD-10-CM | POA: Diagnosis not present

## 2023-01-10 DIAGNOSIS — D66 Hereditary factor VIII deficiency: Secondary | ICD-10-CM | POA: Diagnosis not present

## 2023-01-10 DIAGNOSIS — D680 Von Willebrand's disease: Secondary | ICD-10-CM | POA: Diagnosis not present

## 2023-01-14 DIAGNOSIS — D500 Iron deficiency anemia secondary to blood loss (chronic): Secondary | ICD-10-CM | POA: Diagnosis not present

## 2023-01-14 DIAGNOSIS — D6800 Von Willebrand disease, unspecified: Secondary | ICD-10-CM | POA: Diagnosis not present

## 2023-01-14 DIAGNOSIS — I350 Nonrheumatic aortic (valve) stenosis: Secondary | ICD-10-CM | POA: Diagnosis not present

## 2023-01-14 DIAGNOSIS — K921 Melena: Secondary | ICD-10-CM | POA: Diagnosis not present

## 2023-01-16 DIAGNOSIS — H18513 Endothelial corneal dystrophy, bilateral: Secondary | ICD-10-CM | POA: Diagnosis not present

## 2023-01-16 DIAGNOSIS — Z961 Presence of intraocular lens: Secondary | ICD-10-CM | POA: Diagnosis not present

## 2023-01-16 DIAGNOSIS — H04123 Dry eye syndrome of bilateral lacrimal glands: Secondary | ICD-10-CM | POA: Diagnosis not present

## 2023-01-28 DIAGNOSIS — M25571 Pain in right ankle and joints of right foot: Secondary | ICD-10-CM | POA: Diagnosis not present

## 2023-02-01 ENCOUNTER — Telehealth (HOSPITAL_COMMUNITY): Payer: Self-pay

## 2023-02-01 NOTE — Telephone Encounter (Signed)
Still have not recv'ed requested ppw from Dr. Maylene Roes office. Called and spoke with Dawn at Dr. Maylene Roes office [phone]), Dawn stated she will send the office manager a message to contact [facility]. Dawn verify the fax number [phone], refax ppw.

## 2023-02-13 ENCOUNTER — Telehealth (HOSPITAL_COMMUNITY): Payer: Self-pay

## 2023-02-13 NOTE — Telephone Encounter (Signed)
Called patient to see if she was interested in participating in the Cardiac Rehab Program. Patient stated yes. Patient will come in for orientation on 02/14/23 @ 830AM and will attend the 645AM exercise class.

## 2023-02-14 ENCOUNTER — Encounter (HOSPITAL_COMMUNITY): Payer: Self-pay

## 2023-02-14 ENCOUNTER — Encounter (HOSPITAL_COMMUNITY)
Admission: RE | Admit: 2023-02-14 | Discharge: 2023-02-14 | Disposition: A | Discharge status: Home / Self Care | Payer: PPO | Source: Ambulatory Visit | Attending: Cardiology | Admitting: Cardiology

## 2023-02-14 VITALS — BP 124/70 | HR 82 | Ht 61.75 in | Wt 148.8 lb

## 2023-02-14 DIAGNOSIS — Z952 Presence of prosthetic heart valve: Secondary | ICD-10-CM | POA: Diagnosis not present

## 2023-02-14 DIAGNOSIS — Z48812 Encounter for surgical aftercare following surgery on the circulatory system: Secondary | ICD-10-CM | POA: Insufficient documentation

## 2023-02-14 NOTE — Progress Notes (Signed)
Cardiac Individual Treatment Plan  Patient Details  Name: Alexis [name] MRN: [id number] Date of Birth: Aug 31, 1951 Referring Provider:   Flowsheet Row INTENSIVE CARDIAC REHAB ORIENT from 02/14/2023 in [facility] [place]  Referring Provider Blenda Bridegroom MD (Covering Fransico Him, MD)       Initial Encounter Date:  [place] from 02/14/2023 in [facility] [place] [facility] [place]  Date 02/14/23       Visit Diagnosis: 09/12/22  S/P TAVR at [place]  Patient's Home Medications on Admission:  Current Outpatient Medications:    amoxicillin (AMOXIL) 500 MG capsule, Take 500 mg by mouth as needed (take 4 capsules 30-60 minutes prior to dental procedure)., Disp: , Rfl:    aspirin EC 81 MG tablet, Take 81 mg by mouth daily., Disp: , Rfl:    Calcium Citrate-Vitamin D 315-5 MG-MCG TABS, Take 1 tablet by mouth daily., Disp: , Rfl:    Cholecalciferol 50 MCG (2000 UT) CAPS, Take 1 capsule by mouth daily., Disp: , Rfl:    levothyroxine (SYNTHROID, LEVOTHROID) 125 MCG tablet, Take 125 mcg by mouth daily before breakfast., Disp: , Rfl:    Multiple Vitamin (MULTI-VITAMIN) tablet, Take 1 tablet by mouth daily., Disp: , Rfl:    Polyethyl Glycol-Propyl Glycol (SYSTANE) 0.4-0.3 % GEL ophthalmic gel, Place 0.3 Applications into both eyes as needed (as needed)., Disp: , Rfl:    Von Willebrand Factor, Recomb, (VONVENDI) 1300 units SOLR, Inject 1,300 Units into the vein once a week. Administered once a week, Disp: , Rfl:   Past Medical History: Past Medical History:  Diagnosis Date   Cancer ([place])    breast and thyroid   Personal history of radiation therapy    Thyroid disease    Von Willebrand disease ([place])     Tobacco Use: Social History   Tobacco Use  Smoking Status Former   Types: Cigarettes   Quit date: 1977   Years since quitting: 47.2   Smokeless Tobacco Never  Tobacco Comments   Quit 40 years ago    Labs: Review Flowsheet        No data to display          Capillary Blood Glucose: No results found for: "GLUCAP"   Exercise Target Goals: Exercise Program Goal: Individual exercise prescription set using results from initial 6 min walk test and THRR while considering  patient's activity barriers and safety.   Exercise Prescription Goal: Initial exercise prescription builds to 30-45 minutes a day of aerobic activity, 2-3 days per week.  Home exercise guidelines will be given to patient during program as part of exercise prescription that the participant will acknowledge.  Activity Barriers & Risk Stratification:  Activity Barriers & Cardiac Risk Stratification - 02/14/23 1132       Activity Barriers & Cardiac Risk Stratification   Activity Barriers Joint Problems;Arthritis;Deconditioning;Shortness of Breath;Balance Concerns    Cardiac Risk Stratification High             6 Minute Walk:  6 Minute Walk     Row Name 02/14/23 1127         6 Minute Walk   Phase Initial     Distance 1493 feet     Walk Time 6 minutes     # of Rest Breaks 0     MPH 2.83     METS 3.4     RPE 9  Perceived Dyspnea  0     VO2 Peak 11.9     Symptoms No     Resting HR 81 bpm     Resting BP 124/70     Resting Oxygen Saturation  100 %     Exercise Oxygen Saturation  during 6 min walk 99 %     Max Ex. HR 108 bpm     Max Ex. BP 154/76     2 Minute Post BP 150/78              Oxygen Initial Assessment:   Oxygen Re-Evaluation:   Oxygen Discharge (Final Oxygen Re-Evaluation):   Initial Exercise Prescription:  Initial Exercise Prescription - 02/14/23 1100       Date of Initial Exercise RX and Referring Provider   Date 02/14/23    Referring Provider Blenda Bridegroom MD (Covering Fransico Him, MD)    Expected Discharge Date 04/26/23      Recumbant Bike   Level 1    Watts 45    Minutes 15    METs 1.8       NuStep   Level 1    SPM 70    Minutes 15    METs 1.8      Prescription Details   Frequency (times per week) 3    Duration Progress to 30 minutes of continuous aerobic without signs/symptoms of physical distress      Intensity   THRR 40-80% of Max Heartrate 60-120    Ratings of Perceived Exertion 11-13    Perceived Dyspnea 0-4      Progression   Progression Continue progressive overload as per policy without signs/symptoms or physical distress.      Resistance Training   Training Prescription Yes    Weight 1    Reps 10-15             Perform Capillary Blood Glucose checks as needed.  Exercise Prescription Changes:   Exercise Comments:   Exercise Goals and Review:   Exercise Goals     Row Name 02/14/23 1136             Exercise Goals   Increase Physical Activity Yes       Intervention Provide advice, education, support and counseling about physical activity/exercise needs.;Develop an individualized exercise prescription for aerobic and resistive training based on initial evaluation findings, risk stratification, comorbidities and participant's personal goals.       Expected Outcomes Short Term: Attend rehab on a regular basis to increase amount of physical activity.;Long Term: Exercising regularly at least 3-5 days a week.;Long Term: Add in home exercise to make exercise part of routine and to increase amount of physical activity.       Increase Strength and Stamina Yes       Intervention Provide advice, education, support and counseling about physical activity/exercise needs.;Develop an individualized exercise prescription for aerobic and resistive training based on initial evaluation findings, risk stratification, comorbidities and participant's personal goals.       Expected Outcomes Short Term: Increase workloads from initial exercise prescription for resistance, speed, and METs.;Short Term: Perform resistance training exercises routinely during rehab and  add in resistance training at home;Long Term: Improve cardiorespiratory fitness, muscular endurance and strength as measured by increased METs and functional capacity (6MWT)       Able to understand and use rate of perceived exertion (RPE) scale Yes       Intervention Provide education and explanation on how to use RPE scale  Expected Outcomes Short Term: Able to use RPE daily in rehab to express subjective intensity level;Long Term:  Able to use RPE to guide intensity level when exercising independently       Knowledge and understanding of Target Heart Rate Range (THRR) Yes       Intervention Provide education and explanation of THRR including how the numbers were predicted and where they are located for reference       Expected Outcomes Short Term: Able to state/look up THRR;Long Term: Able to use THRR to govern intensity when exercising independently;Short Term: Able to use daily as guideline for intensity in rehab       Understanding of Exercise Prescription Yes       Intervention Provide education, explanation, and written materials on patient's individual exercise prescription       Expected Outcomes Short Term: Able to explain program exercise prescription;Long Term: Able to explain home exercise prescription to exercise independently                Exercise Goals Re-Evaluation :   Discharge Exercise Prescription (Final Exercise Prescription Changes):   Nutrition:  Target Goals: Understanding of nutrition guidelines, daily intake of sodium 1500mg , cholesterol 200mg , calories 30% from fat and 7% or less from saturated fats, daily to have 5 or more servings of fruits and vegetables.  Biometrics:  Pre Biometrics - 02/14/23 1126       Pre Biometrics   Waist Circumference 38.75 inches    Hip Circumference 39.5 inches    Waist to Hip Ratio 0.98 %    Triceps Skinfold 26 mm    % Body Fat 40.7 %    Grip Strength 26 kg             Post Biometrics - 02/14/23 1126         Post  Biometrics   Flexibility 13 in   [place] abdominal pain post 1st attempt, did not continue testing, no single leg stand            Nutrition Therapy Plan and Nutrition Goals:   Nutrition Assessments:  MEDIFICTS Score Key: ?70 Need to make dietary changes  40-70 Heart Healthy Diet ? 40 Therapeutic Level Cholesterol Diet    Picture Your Plate Scores: [id number] Unhealthy dietary pattern with much room for improvement. 41-50 Dietary pattern unlikely to meet recommendations for good health and room for improvement. 51-60 More healthful dietary pattern, with some room for improvement.  >60 Healthy dietary pattern, although there may be some specific behaviors that could be improved.    Nutrition Goals Re-Evaluation:   Nutrition Goals Re-Evaluation:   Nutrition Goals Discharge (Final Nutrition Goals Re-Evaluation):   Psychosocial: Target Goals: Acknowledge presence or absence of significant depression and/or stress, maximize coping skills, provide positive support system. Participant is able to verbalize types and ability to use techniques and skills needed for reducing stress and depression.  Initial Review & Psychosocial Screening:  Initial Psych Review & Screening - 02/14/23 1029       Initial Review   Current issues with Current Stress Concerns    Source of Stress Concerns Chronic Illness    Comments No psychosocial needs identified. No interventions needed at this time. Lilas does receive weekly infusions for her [place]? Yes   Tyshell has her husband and 5 children for support     Barriers   Psychosocial barriers to participate in program The patient should  benefit from training in stress management and relaxation.      Screening Interventions   Interventions Encouraged to exercise;Provide feedback about the scores to participant    Expected Outcomes Long Term Goal: Stressors or current issues are controlled or  eliminated.;Short Term goal: Identification and review with participant of any Quality of Life or Depression concerns found by scoring the questionnaire.;Long Term goal: The participant improves quality of Life and PHQ9 Scores as seen by post scores and/or verbalization of changes             Quality of Life Scores:  Quality of Life - 02/14/23 1149       Quality of Life   Select Quality of Life      Quality of Life Scores   Health/Function Pre 18.9 %    Socioeconomic Pre 23.43 %    Psych/Spiritual Pre 25.86 %    Family Pre 21.6 %    GLOBAL Pre 21.66 %            Scores of 19 and below usually indicate a poorer quality of life in these areas.  A difference of  2-3 points is a clinically meaningful difference.  A difference of 2-3 points in the total score of the Quality of Life Index has been associated with significant improvement in overall quality of life, self-image, physical symptoms, and general health in studies assessing change in quality of life.  PHQ-9: Review Flowsheet       02/14/2023  Depression screen PHQ 2/9  Decreased Interest 0  Down, Depressed, Hopeless 0  PHQ - 2 Score 0  Altered sleeping 1  Tired, decreased energy 1  Change in appetite 3  Feeling bad or failure about yourself  0  Trouble concentrating 0  Moving slowly or fidgety/restless 0  Suicidal thoughts 0  PHQ-9 Score 5  Difficult doing work/chores Somewhat difficult   Interpretation of Total Score  Total Score Depression Severity:  1-4 = Minimal depression, 5-9 = Mild depression, 10-14 = Moderate depression, 15-19 = Moderately severe depression, 20-27 = Severe depression   Psychosocial Evaluation and Intervention:   Psychosocial Re-Evaluation:   Psychosocial Discharge (Final Psychosocial Re-Evaluation):   Vocational Rehabilitation: Provide vocational rehab assistance to qualifying candidates.   Vocational Rehab Evaluation & Intervention:  Vocational Rehab - 02/14/23 1341        Initial Vocational Rehab Evaluation & Intervention   Assessment shows need for Vocational Rehabilitation No   Nao is retired and does not need vocational rehab at this time            Education: Education Goals: Education classes will be provided on a weekly basis, covering required topics. Participant will state understanding/return demonstration of topics presented.     Core Videos: Exercise    Move It!  Clinical staff conducted group or individual video education with verbal and written material and guidebook.  Patient learns the recommended Pritikin exercise program. Exercise with the goal of living a long, healthy life. Some of the health benefits of exercise include controlled diabetes, healthier blood pressure levels, improved cholesterol levels, improved heart and lung capacity, improved sleep, and better body composition. Everyone should speak with their doctor before starting or changing an exercise routine.  Biomechanical Limitations Clinical staff conducted group or individual video education with verbal and written material and guidebook.  Patient learns how biomechanical limitations can impact exercise and how we can mitigate and possibly overcome limitations to have an impactful and balanced exercise routine.  Body Composition  Clinical staff conducted group or individual video education with verbal and written material and guidebook.  Patient learns that body composition (ratio of muscle mass to fat mass) is a key component to assessing overall fitness, rather than body weight alone. Increased fat mass, especially visceral belly fat, can put [place] at increased risk for metabolic syndrome, type 2 diabetes, heart disease, and even death. It is recommended to combine diet and exercise (cardiovascular and resistance training) to improve your body composition. Seek guidance from your physician and exercise physiologist before implementing an exercise routine.  Exercise Action  Plan Clinical staff conducted group or individual video education with verbal and written material and guidebook.  Patient learns the recommended strategies to achieve and enjoy long-term exercise adherence, including variety, self-motivation, self-efficacy, and positive decision making. Benefits of exercise include fitness, good health, weight management, more energy, better sleep, less stress, and overall well-being.  Medical   Heart Disease Risk Reduction Clinical staff conducted group or individual video education with verbal and written material and guidebook.  Patient learns our heart is our most vital organ as it circulates oxygen, nutrients, white blood cells, and hormones throughout the entire body, and carries waste away. Data supports a plant-based eating plan like the Pritikin Program for its effectiveness in slowing progression of and reversing heart disease. The video provides a number of recommendations to address heart disease.   Metabolic Syndrome and Belly Fat  Clinical staff conducted group or individual video education with verbal and written material and guidebook.  Patient learns what metabolic syndrome is, how it leads to heart disease, and how one can reverse it and keep it from coming back. You have metabolic syndrome if you have 3 of the following 5 criteria: abdominal obesity, high blood pressure, high triglycerides, low HDL cholesterol, and high blood sugar.  Hypertension and Heart Disease Clinical staff conducted group or individual video education with verbal and written material and guidebook.  Patient learns that high blood pressure, or hypertension, is very common in the [place]. Hypertension is largely due to excessive salt intake, but other important risk factors include being overweight, physical inactivity, drinking too much alcohol, smoking, and not eating enough potassium from fruits and vegetables. High blood pressure is a leading risk factor for heart  attack, stroke, congestive heart failure, dementia, kidney failure, and premature death. Long-term effects of excessive salt intake include stiffening of the arteries and thickening of heart muscle and organ damage. Recommendations include ways to reduce hypertension and the risk of heart disease.  Diseases of Our Time - Focusing on Diabetes Clinical staff conducted group or individual video education with verbal and written material and guidebook.  Patient learns why the best way to stop diseases of our time is prevention, through food and other lifestyle changes. Medicine (such as prescription pills and surgeries) is often only a Band-Aid on the problem, not a long-term solution. Most common diseases of our time include obesity, type 2 diabetes, hypertension, heart disease, and cancer. The Pritikin Program is recommended and has been proven to help reduce, reverse, and/or prevent the damaging effects of metabolic syndrome.  Nutrition   Overview of the Pritikin Eating Plan  Clinical staff conducted group or individual video education with verbal and written material and guidebook.  Patient learns about the [place] for disease risk reduction. The [place] emphasizes a wide variety of unrefined, minimally-processed carbohydrates, like fruits, vegetables, whole grains, and legumes. Go, Caution, and Stop food choices are explained. Plant-based  and lean animal proteins are emphasized. Rationale provided for low sodium intake for blood pressure control, low added sugars for blood sugar stabilization, and low added fats and oils for coronary artery disease risk reduction and weight management.  Calorie Density  Clinical staff conducted group or individual video education with verbal and written material and guidebook.  Patient learns about calorie density and how it impacts the Pritikin Eating Plan. Knowing the characteristics of the food you choose will help you decide whether those  foods will lead to weight gain or weight loss, and whether you want to consume more or less of them. Weight loss is usually a side effect of the Pritikin Eating Plan because of its focus on low calorie-dense foods.  Label Reading  Clinical staff conducted group or individual video education with verbal and written material and guidebook.  Patient learns about the Pritikin recommended label reading guidelines and corresponding recommendations regarding calorie density, added sugars, sodium content, and whole grains.  Dining Out - Part 1  Clinical staff conducted group or individual video education with verbal and written material and guidebook.  Patient learns that restaurant meals can be sabotaging because they can be so high in calories, fat, sodium, and/or sugar. Patient learns recommended strategies on how to positively address this and avoid unhealthy pitfalls.  Facts on Fats  Clinical staff conducted group or individual video education with verbal and written material and guidebook.  Patient learns that lifestyle modifications can be just as effective, if not more so, as many medications for lowering your risk of heart disease. A Pritikin lifestyle can help to reduce your risk of inflammation and atherosclerosis (cholesterol build-up, or plaque, in the artery walls). Lifestyle interventions such as dietary choices and physical activity address the cause of atherosclerosis. A review of the types of fats and their impact on blood cholesterol levels, along with dietary recommendations to reduce fat intake is also included.  Nutrition Action Plan  Clinical staff conducted group or individual video education with verbal and written material and guidebook.  Patient learns how to incorporate Pritikin recommendations into their lifestyle. Recommendations include planning and keeping personal health goals in mind as an important part of their success.  Healthy Mind-Set    Healthy Minds, Bodies, Hearts   Clinical staff conducted group or individual video education with verbal and written material and guidebook.  Patient learns how to identify when they are stressed. Video will discuss the impact of that stress, as well as the many benefits of stress management. Patient will also be introduced to stress management techniques. The way we think, act, and feel has an impact on our hearts.  How Our Thoughts Can Heal Our Hearts  Clinical staff conducted group or individual video education with verbal and written material and guidebook.  Patient learns that negative thoughts can cause depression and anxiety. This can result in negative lifestyle behavior and serious health problems. Cognitive behavioral therapy is an effective method to help control our thoughts in order to change and improve our emotional outlook.  Additional Videos:  Exercise    Improving Performance  Clinical staff conducted group or individual video education with verbal and written material and guidebook.  Patient learns to use a non-linear approach by alternating intensity levels and lengths of time spent exercising to help burn more calories and lose more body fat. Cardiovascular exercise helps improve heart health, metabolism, hormonal balance, blood sugar control, and recovery from fatigue. Resistance training improves strength, endurance, balance, coordination, reaction time,  metabolism, and muscle mass. Flexibility exercise improves circulation, posture, and balance. Seek guidance from your physician and exercise physiologist before implementing an exercise routine and learn your capabilities and proper form for all exercise.  Introduction to Yoga  Clinical staff conducted group or individual video education with verbal and written material and guidebook.  Patient learns about yoga, a discipline of the coming together of mind, breath, and body. The benefits of yoga include improved flexibility, improved range of motion, better  posture and core strength, increased lung function, weight loss, and positive self-image. Yoga's heart health benefits include lowered blood pressure, healthier heart rate, decreased cholesterol and triglyceride levels, improved immune function, and reduced stress. Seek guidance from your physician and exercise physiologist before implementing an exercise routine and learn your capabilities and proper form for all exercise.  Medical   Aging: Enhancing Your Quality of Life  Clinical staff conducted group or individual video education with verbal and written material and guidebook.  Patient learns key strategies and recommendations to stay in good physical health and enhance quality of life, such as prevention strategies, having an advocate, securing a [place], and keeping a list of medications and system for tracking them. It also discusses how to avoid risk for bone loss.  Biology of Weight Control  Clinical staff conducted group or individual video education with verbal and written material and guidebook.  Patient learns that weight gain occurs because we consume more calories than we burn (eating more, moving less). Even if your body weight is normal, you may have higher ratios of fat compared to muscle mass. Too much body fat puts you at increased risk for cardiovascular disease, heart attack, stroke, type 2 diabetes, and obesity-related cancers. In addition to exercise, following the [place] can help reduce your risk.  Decoding Lab Results  Clinical staff conducted group or individual video education with verbal and written material and guidebook.  Patient learns that lab test reflects one measurement whose values change over time and are influenced by many factors, including medication, stress, sleep, exercise, food, hydration, pre-existing medical conditions, and more. It is recommended to use the knowledge from this video to become more involved with  your lab results and evaluate your numbers to speak with your doctor.   Diseases of Our Time - Overview  Clinical staff conducted group or individual video education with verbal and written material and guidebook.  Patient learns that according to the CDC, 50% to 70% of chronic diseases (such as obesity, type 2 diabetes, elevated lipids, hypertension, and heart disease) are avoidable through lifestyle improvements including healthier food choices, listening to satiety cues, and increased physical activity.  Sleep Disorders Clinical staff conducted group or individual video education with verbal and written material and guidebook.  Patient learns how good quality and duration of sleep are important to overall health and well-being. Patient also learns about sleep disorders and how they impact health along with recommendations to address them, including discussing with a physician.  Nutrition  Dining Out - Part 2 Clinical staff conducted group or individual video education with verbal and written material and guidebook.  Patient learns how to plan ahead and communicate in order to maximize their dining experience in a healthy and nutritious manner. Included are recommended food choices based on the type of restaurant the patient is visiting.   Fueling a Best boy conducted group or individual video education with verbal and written material and guidebook.  There is a strong connection between our food choices and our health. Diseases like obesity and type 2 diabetes are very prevalent and are in large-part due to lifestyle choices. The Pritikin Eating Plan provides plenty of food and hunger-curbing satisfaction. It is easy to follow, affordable, and helps reduce health risks.  Menu Workshop  Clinical staff conducted group or individual video education with verbal and written material and guidebook.  Patient learns that restaurant meals can sabotage health goals because they are  often packed with calories, fat, sodium, and sugar. Recommendations include strategies to plan ahead and to communicate with the manager, chef, or server to help order a healthier meal.  Planning Your Eating Strategy  Clinical staff conducted group or individual video education with verbal and written material and guidebook.  Patient learns about the [place] and its benefit of reducing the risk of disease. The [place] does not focus on calories. Instead, it emphasizes high-quality, nutrient-rich foods. By knowing the characteristics of the foods, we choose, we can determine their calorie density and make informed decisions.  Targeting Your Nutrition Priorities  Clinical staff conducted group or individual video education with verbal and written material and guidebook.  Patient learns that lifestyle habits have a tremendous impact on disease risk and progression. This video provides eating and physical activity recommendations based on your personal health goals, such as reducing LDL cholesterol, losing weight, preventing or controlling type 2 diabetes, and reducing high blood pressure.  Vitamins and Minerals  Clinical staff conducted group or individual video education with verbal and written material and guidebook.  Patient learns different ways to obtain key vitamins and minerals, including through a recommended healthy diet. It is important to discuss all supplements you take with your doctor.   Healthy Mind-Set    Smoking Cessation  Clinical staff conducted group or individual video education with verbal and written material and guidebook.  Patient learns that cigarette smoking and tobacco addiction pose a serious health risk which affects millions of people. Stopping smoking will significantly reduce the risk of heart disease, lung disease, and many forms of cancer. Recommended strategies for quitting are covered, including working with your doctor to develop a  successful plan.  Culinary   Becoming a Financial trader conducted group or individual video education with verbal and written material and guidebook.  Patient learns that cooking at home can be healthy, cost-effective, quick, and puts them in control. Keys to cooking healthy recipes will include looking at your recipe, assessing your equipment needs, planning ahead, making it simple, choosing cost-effective seasonal ingredients, and limiting the use of added fats, salts, and sugars.  Cooking - Breakfast and Snacks  Clinical staff conducted group or individual video education with verbal and written material and guidebook.  Patient learns how important breakfast is to satiety and nutrition through the entire day. Recommendations include key foods to eat during breakfast to help stabilize blood sugar levels and to prevent overeating at meals later in the day. Planning ahead is also a key component.  Cooking - Human resources officer conducted group or individual video education with verbal and written material and guidebook.  Patient learns eating strategies to improve overall health, including an approach to cook more at home. Recommendations include thinking of animal protein as a side on your plate rather than center stage and focusing instead on lower calorie dense options like vegetables, fruits, whole grains, and plant-based proteins, such as beans. Making sauces  in large quantities to freeze for later and leaving the skin on your vegetables are also recommended to maximize your experience.  Cooking - Healthy Salads and Dressing Clinical staff conducted group or individual video education with verbal and written material and guidebook.  Patient learns that vegetables, fruits, whole grains, and legumes are the foundations of the [place]. Recommendations include how to incorporate each of these in flavorful and healthy salads, and how to create homemade salad  dressings. Proper handling of ingredients is also covered. Cooking - Soups and [employer] - Soups and Desserts Clinical staff conducted group or individual video education with verbal and written material and guidebook.  Patient learns that Pritikin soups and desserts make for easy, nutritious, and delicious snacks and meal components that are low in sodium, fat, sugar, and calorie density, while high in vitamins, minerals, and filling fiber. Recommendations include simple and healthy ideas for soups and desserts.   Overview     The Pritikin Solution Program Overview Clinical staff conducted group or individual video education with verbal and written material and guidebook.  Patient learns that the results of the [place] Program have been documented in more than 100 articles published in peer-reviewed journals, and the benefits include reducing risk factors for (and, in some cases, even reversing) high cholesterol, high blood pressure, type 2 diabetes, obesity, and more! An overview of the three key pillars of the Pritikin Program will be covered: eating well, doing regular exercise, and having a healthy mind-set.  WORKSHOPS  Exercise: Exercise Basics: Building Your Action Plan Clinical staff led group instruction and group discussion with PowerPoint presentation and patient guidebook. To enhance the learning environment the use of posters, models and videos may be added. At the conclusion of this workshop, patients will comprehend the difference between physical activity and exercise, as well as the benefits of incorporating both, into their routine. Patients will understand the FITT (Frequency, Intensity, Time, and Type) principle and how to use it to build an exercise action plan. In addition, safety concerns and other considerations for exercise and cardiac rehab will be addressed by the presenter. The purpose of this lesson is to promote a comprehensive and effective weekly exercise  routine in order to improve patients' overall level of fitness.   Managing Heart Disease: Your Path to a Healthier Heart Clinical staff led group instruction and group discussion with PowerPoint presentation and patient guidebook. To enhance the learning environment the use of posters, models and videos may be added.At the conclusion of this workshop, patients will understand the anatomy and physiology of the heart. Additionally, they will understand how Pritikin's three pillars impact the risk factors, the progression, and the management of heart disease.  The purpose of this lesson is to provide a high-level overview of the heart, heart disease, and how the Pritikin lifestyle positively impacts risk factors.  Exercise Biomechanics Clinical staff led group instruction and group discussion with PowerPoint presentation and patient guidebook. To enhance the learning environment the use of posters, models and videos may be added. Patients will learn how the structural parts of their bodies function and how these functions impact their daily activities, movement, and exercise. Patients will learn how to promote a neutral spine, learn how to manage pain, and identify ways to improve their physical movement in order to promote healthy living. The purpose of this lesson is to expose patients to common physical limitations that impact physical activity. Participants will learn practical ways to adapt and manage aches  and pains, and to minimize their effect on regular exercise. Patients will learn how to maintain good posture while sitting, walking, and lifting.  Balance Training and Fall Prevention  Clinical staff led group instruction and group discussion with PowerPoint presentation and patient guidebook. To enhance the learning environment the use of posters, models and videos may be added. At the conclusion of this workshop, patients will understand the importance of their sensorimotor skills  (vision, proprioception, and the vestibular system) in maintaining their ability to balance as they age. Patients will apply a variety of balancing exercises that are appropriate for their current level of function. Patients will understand the common causes for poor balance, possible solutions to these problems, and ways to modify their physical environment in order to minimize their fall risk. The purpose of this lesson is to teach patients about the importance of maintaining balance as they age and ways to minimize their risk of falling.  WORKSHOPS   Nutrition:  Fueling a Scientist, research (physical sciences) led group instruction and group discussion with PowerPoint presentation and patient guidebook. To enhance the learning environment the use of posters, models and videos may be added. Patients will review the foundational principles of the [place] and understand what constitutes a serving size in each of the food groups. Patients will also learn Pritikin-friendly foods that are better choices when away from home and review make-ahead meal and snack options. Calorie density will be reviewed and applied to three nutrition priorities: weight maintenance, weight loss, and weight gain. The purpose of this lesson is to reinforce (in a group setting) the key concepts around what patients are recommended to eat and how to apply these guidelines when away from home by planning and selecting Pritikin-friendly options. Patients will understand how calorie density may be adjusted for different weight management goals.  Mindful Eating  Clinical staff led group instruction and group discussion with PowerPoint presentation and patient guidebook. To enhance the learning environment the use of posters, models and videos may be added. Patients will briefly review the concepts of the [place] and the importance of low-calorie dense foods. The concept of mindful eating will be introduced as well as the  importance of paying attention to internal hunger signals. Triggers for non-hunger eating and techniques for dealing with triggers will be explored. The purpose of this lesson is to provide patients with the opportunity to review the basic principles of the [place], discuss the value of eating mindfully and how to measure internal cues of hunger and fullness using the Hunger Scale. Patients will also discuss reasons for non-hunger eating and learn strategies to use for controlling emotional eating.  Targeting Your Nutrition Priorities Clinical staff led group instruction and group discussion with PowerPoint presentation and patient guidebook. To enhance the learning environment the use of posters, models and videos may be added. Patients will learn how to determine their genetic susceptibility to disease by reviewing their family history. Patients will gain insight into the importance of diet as part of an overall healthy lifestyle in mitigating the impact of genetics and other environmental insults. The purpose of this lesson is to provide patients with the opportunity to assess their personal nutrition priorities by looking at their family history, their own health history and current risk factors. Patients will also be able to discuss ways of prioritizing and modifying the [place] for their highest risk areas  Menu  Clinical staff led group instruction and group discussion with  PowerPoint presentation and patient guidebook. To enhance the learning environment the use of posters, models and videos may be added. Using menus brought in from [employer], or printed from [employer], patients will apply the [place] dining out guidelines that were presented in the [employer] video. Patients will also be able to practice these guidelines in a variety of provided scenarios. The purpose of this lesson is to provide patients with the opportunity to practice  hands-on learning of the [place] with actual menus and practice scenarios.  Label Reading Clinical staff led group instruction and group discussion with PowerPoint presentation and patient guidebook. To enhance the learning environment the use of posters, models and videos may be added. Patients will review and discuss the Pritikin label reading guidelines presented in Pritikin's Label Reading Educational series video. Using fool labels brought in from local grocery stores and markets, patients will apply the label reading guidelines and determine if the packaged food meet the Pritikin guidelines. The purpose of this lesson is to provide patients with the opportunity to review, discuss, and practice hands-on learning of the Pritikin Label Reading guidelines with actual packaged food labels. [place] Workshops are designed to teach patients ways to prepare quick, simple, and affordable recipes at home. The importance of nutrition's role in chronic disease risk reduction is reflected in its emphasis in the overall Pritikin program. By learning how to prepare essential core Pritikin Eating Plan recipes, patients will increase control over what they eat; be able to customize the flavor of foods without the use of added salt, sugar, or fat; and improve the quality of the food they consume. By learning a set of core recipes which are easily assembled, quickly prepared, and affordable, patients are more likely to prepare more healthy foods at home. These workshops focus on convenient breakfasts, simple entres, side dishes, and desserts which can be prepared with minimal effort and are consistent with nutrition recommendations for cardiovascular risk reduction. Cooking [employer] are taught by a Engineer, materials (RD) who has been trained by the [employer]. The chef or RD has a clear understanding of the importance of minimizing -  if not completely eliminating - added fat, sugar, and sodium in recipes. Throughout the series of [place] Workshop sessions, patients will learn about healthy ingredients and efficient methods of cooking to build confidence in their capability to prepare    Cooking School weekly topics:  Adding Flavor- Sodium-Free  Fast and Healthy Breakfasts  Powerhouse Plant-Based Proteins  Satisfying Salads and Dressings  Simple Sides and Sauces  International Cuisine-Spotlight on the [place] Zones  Delicious Desserts  Savory Soups  [employer] - Meals in a Agricultural consultant Appetizers and Snacks  Comforting Weekend Breakfasts  One-Pot Wonders   Fast Evening Meals  Contractor Your Pritikin Plate  WORKSHOPS   Healthy Mindset (Psychosocial):  Focused Goals, Sustainable Changes Clinical staff led group instruction and group discussion with PowerPoint presentation and patient guidebook. To enhance the learning environment the use of posters, models and videos may be added. Patients will be able to apply effective goal setting strategies to establish at least one personal goal, and then take consistent, meaningful action toward that goal. They will learn to identify common barriers to achieving personal goals and develop strategies to overcome them. Patients will also gain an understanding of how our mind-set can impact our ability to achieve goals and the importance  of cultivating a positive and growth-oriented mind-set. The purpose of this lesson is to provide patients with a deeper understanding of how to set and achieve personal goals, as well as the tools and strategies needed to overcome common obstacles which may arise along the way.  From Head to Heart: The Power of a Healthy Outlook  Clinical staff led group instruction and group discussion with PowerPoint presentation and patient guidebook. To enhance the learning environment the use of posters, models and videos may be  added. Patients will be able to recognize and describe the impact of emotions and mood on physical health. They will discover the importance of self-care and explore self-care practices which may work for them. Patients will also learn how to utilize the 4 C's to cultivate a healthier outlook and better manage stress and challenges. The purpose of this lesson is to demonstrate to patients how a healthy outlook is an essential part of maintaining good health, especially as they continue their cardiac rehab journey.  Healthy Sleep for a Healthy Heart Clinical staff led group instruction and group discussion with PowerPoint presentation and patient guidebook. To enhance the learning environment the use of posters, models and videos may be added. At the conclusion of this workshop, patients will be able to demonstrate knowledge of the importance of sleep to overall health, well-being, and quality of life. They will understand the symptoms of, and treatments for, common sleep disorders. Patients will also be able to identify daytime and nighttime behaviors which impact sleep, and they will be able to apply these tools to help manage sleep-related challenges. The purpose of this lesson is to provide patients with a general overview of sleep and outline the importance of quality sleep. Patients will learn about a few of the most common sleep disorders. Patients will also be introduced to the concept of "sleep hygiene," and discover ways to self-manage certain sleeping problems through simple daily behavior changes. Finally, the workshop will motivate patients by clarifying the links between quality sleep and their goals of heart-healthy living.   Recognizing and Reducing Stress Clinical staff led group instruction and group discussion with PowerPoint presentation and patient guidebook. To enhance the learning environment the use of posters, models and videos may be added. At the conclusion of this workshop, patients  will be able to understand the types of stress reactions, differentiate between acute and chronic stress, and recognize the impact that chronic stress has on their health. They will also be able to apply different coping mechanisms, such as reframing negative self-talk. Patients will have the opportunity to practice a variety of stress management techniques, such as deep abdominal breathing, progressive muscle relaxation, and/or guided imagery.  The purpose of this lesson is to educate patients on the role of stress in their lives and to provide healthy techniques for coping with it.  Learning Barriers/Preferences:  Learning Barriers/Preferences - 02/14/23 1149       Learning Barriers/Preferences   Learning Barriers Sight;Exercise Concerns    Learning Preferences Individual Instruction;Group Instruction;Pictoral;Skilled Demonstration;Written Material             Education Topics:  Knowledge Questionnaire Score:  Knowledge Questionnaire Score - 02/14/23 1150       Knowledge Questionnaire Score   Pre Score 23/24             Core Components/Risk Factors/Patient Goals at Admission:  Personal Goals and Risk Factors at Admission - 02/14/23 1151       Core Components/Risk Factors/Patient Goals on Admission  Weight Management Yes    Intervention Weight Management: Develop a combined nutrition and exercise program designed to reach desired caloric intake, while maintaining appropriate intake of nutrient and fiber, sodium and fats, and appropriate energy expenditure required for the weight goal.;Weight Management: Provide education and appropriate resources to help participant work on and attain dietary goals.    Expected Outcomes Short Term: Continue to assess and modify interventions until short term weight is achieved;Long Term: Adherence to nutrition and physical activity/exercise program aimed toward attainment of established weight goal;Understanding recommendations for meals to  include 15-35% energy as protein, 25-35% energy from fat, 35-60% energy from carbohydrates, less than 200mg  of dietary cholesterol, 20-35 gm of total fiber daily;Understanding of distribution of calorie intake throughout the day with the consumption of 4-5 meals/snacks    Lipids Yes    Intervention Provide education and support for participant on nutrition & aerobic/resistive exercise along with prescribed medications to achieve LDL 70mg , HDL >40mg .    Expected Outcomes Short Term: Participant states understanding of desired cholesterol values and is compliant with medications prescribed. Participant is following exercise prescription and nutrition guidelines.;Long Term: Cholesterol controlled with medications as prescribed, with individualized exercise RX and with personalized nutrition plan. Value goals: LDL < 70mg , HDL > 40 mg.    Stress Yes    Intervention Offer individual and/or small group education and counseling on adjustment to heart disease, stress management and health-related lifestyle change. Teach and support self-help strategies.;Refer participants experiencing significant psychosocial distress to appropriate mental health specialists for further evaluation and treatment. When possible, include family members and significant others in education/counseling sessions.    Expected Outcomes Short Term: Participant demonstrates changes in health-related behavior, relaxation and other stress management skills, ability to obtain effective social support, and compliance with psychotropic medications if prescribed.;Long Term: Emotional wellbeing is indicated by absence of clinically significant psychosocial distress or social isolation.    Personal Goal Other Yes    Personal Goal Short term: Be more active, eat more fresh produce, Long term: strength    Intervention Will continue to monitor pt and progress workloads as tolerated without sign or symptom    Expected Outcomes Pt will achieve her goals  and gain strength             Core Components/Risk Factors/Patient Goals Review:    Core Components/Risk Factors/Patient Goals at Discharge (Final Review):    ITP Comments:  ITP Comments     Row Name 02/14/23 0827           ITP Comments Dr. Fransico Him medical director. Introduction to pritikin education program/ intensive cardiac rehab. Initial orientation packet reviewed with patient                Comments: Participant attended orientation for the cardiac rehabilitation program on  02/14/2023  to perform initial intake and exercise walk test. Patient introduced to the [place] education and orientation packet was reviewed. Completed 6-minute walk test, measurements, initial ITP, and exercise prescription. Vital signs stable. Telemetry-normal sinus rhythm, with arrhythmia asymptomatic. Jontia did report having a sharp pain in her RUQ of her abdomen during the sit and reach test after  her 6 minute walk test  was completed. The pain  resolved in a few minutes. I advised Gerriann if this pain reoccurs to notify her primary care provider Dr Ashby Dawes. [place] had no complaints upon exit from cardiac rehab. Harrell Gave RN BSN   Service time was from [phone] to [phone].

## 2023-02-14 NOTE — Progress Notes (Signed)
Cardiac Rehab Medication Review by a Nurse  Does the patient  feel that his/her medications are working for him/her?  yes  Has the patient been experiencing any side effects to the medications prescribed?  yes  Does the patient measure his/her own blood pressure or blood glucose at home?  yes   Does the patient have any problems obtaining medications due to transportation or finances?   no  Understanding of regimen: excellent Understanding of indications: excellent Potential of compliance: excellent    Nurse comments: Alexis [name] is taking her medications as prescribed. Alexis [name] says she has gas as a side effect from one of the medications she takes. Alexis [name] receives a home infusion once a week. The nurse that comes to her home checks her blood pressures. Medications reconciled from [place] RN BSN     Christa See [facility] 02/14/2023 9:57 AM

## 2023-02-18 ENCOUNTER — Encounter (HOSPITAL_COMMUNITY)
Admission: RE | Admit: 2023-02-18 | Discharge: 2023-02-18 | Disposition: A | Discharge status: Home / Self Care | Payer: PPO | Source: Ambulatory Visit | Attending: Cardiology | Admitting: Cardiology

## 2023-02-18 DIAGNOSIS — Z952 Presence of prosthetic heart valve: Secondary | ICD-10-CM | POA: Diagnosis not present

## 2023-02-18 NOTE — Progress Notes (Signed)
Quality of Life - 02/14/23 1149       Quality of Life   Select Quality of Life      Quality of Life Scores   Health/Function Pre 18.9 %    Socioeconomic Pre 23.43 %    Psych/Spiritual Pre 25.86 %    Family Pre 21.6 %    GLOBAL Pre 21.66 %             QUALITY OF LIFE SCORE REVIEW  Pt completed Quality of Life survey as a participant in Cardiac Rehab.  Scores 19.0 or below are considered low.  Pt scored low in Health and Function 18.9. Patient quality of life slightly altered by physical constraints which limits ability to perform as prior to recent cardiac illness. Jonah feels she was faced with her own immortality with this cardiac event/procedure. Faced with her own immortality concerned mostly for her daughter who is in her 31's and not married.  She wonders how her daughter who wants a companion will do in the event something should happen.  Pt husband who is well known to cardiac rehab staff as a prior participant is currently in our pulmonary rehab program.  [place] was worried about how he would do but she can already see that it is making a difference and they hope to exercise together.  Offered counseling support.  Declined but is open to this should anything change as she feels she needs to help .  Participated in marriage counseling before. Offered emotional support and reassurance.  Will continue to monitor and intervene as necessary. Cherre Huger, BSN Cardiac and Training and development officer

## 2023-02-18 NOTE — Progress Notes (Signed)
Daily Session Note  Patient Details  Name: Alexis [name] MRN: [id number] Date of Birth: [birth year] Referring Provider:   Flowsheet Row INTENSIVE CARDIAC REHAB ORIENT from 02/14/2023 in [facility] & [place]  Referring Provider Alexis Bridegroom MD (Covering Alexis Him, MD)       Encounter Date: 02/18/2023  Check In:  Session Check In - 02/18/23 0725       Check-In   Supervising physician immediately available to respond to emergencies [facility] - Physician supervision    Physician(s) Alexis Bame, NP    Location [facility]    Staff Present Alexis [name] BS, ACSM-CEP, Exercise Physiologist;Alexis Celesta Aver, MS, ACSM-CEP, Exercise Physiologist;Alexis Pearline Cables, MS, Exercise Physiologist;Alexis Pfeifer Wilber Oliphant, RN, BSN    Virtual Visit No    Medication changes reported     No    Fall or balance concerns reported    No    Tobacco Cessation No Change    Warm-up and Cool-down Performed as group-led instruction    Resistance Training Performed Yes    VAD Patient? No    PAD/SET Patient? No      Pain Assessment   Currently in Pain? No/denies    Pain Score 0-No pain    Multiple Pain Sites No             Capillary Blood Glucose: No results found for this or any previous visit (from the past 24 hour(s)).    Social History   Tobacco Use  Smoking Status Former   Types: Cigarettes   Quit date: 1977   Years since quitting: 47.2  Smokeless Tobacco Never  Tobacco Comments   Quit 40 years ago    Goals Met:  Exercise tolerated well Personal goals reviewed No report of concerns or symptoms today Strength training completed today  Goals Unmet:  Not Applicable  Comments: Pt in today for Intensive cardiac rehab today.  Pt tolerated light exercise without difficulty. VSS, telemetry-SR, asymptomatic.  Medication list reconciled. Pt denies barriers to medication compliance.  PSYCHOSOCIAL ASSESSMENT:  PHQ-5. Pt exhibits  positive coping skills, hopeful outlook with supportive family. Alexis [name] admits that she does worry some about her daughter who is in her 46's never married who has a 71 year old  Would like for her daughter to have a husband  for companionship as she ages. Denies any counseling support however is open to this if she felt it is needed.  Did marriage counseling some years back.  General health related concerns during her post procedure. Pt enjoys craft related hobbies such as furniture flip, house projects and sewing.   Pt oriented to exercise equipment and routine.  Understanding verbalized.    Dr. Fransico [name] is Medical Director for Cardiac Rehab at [facility].

## 2023-02-19 ENCOUNTER — Telehealth: Payer: Self-pay | Admitting: *Deleted

## 2023-02-19 NOTE — Patient Outreach (Signed)
  Care Coordination   02/19/2023 Name: Alexis [name] MRN: [id number] DOB: 27-Aug-1951   Care Coordination Outreach Attempts:  An unsuccessful telephone outreach was attempted today to offer the patient information about available care coordination services as a benefit of their health plan.   Follow Up Plan:  Additional outreach attempts will be made to offer the patient care coordination information and services.   Encounter Outcome:  No Answer   Care Coordination Interventions:  No, not indicated    Eduard Clos, MSW, [place] Worker [employer] [phone]

## 2023-02-20 ENCOUNTER — Telehealth (HOSPITAL_COMMUNITY): Payer: Self-pay | Admitting: *Deleted

## 2023-02-20 ENCOUNTER — Encounter (HOSPITAL_COMMUNITY): Payer: PPO

## 2023-02-22 ENCOUNTER — Encounter (HOSPITAL_COMMUNITY): Payer: PPO

## 2023-02-25 ENCOUNTER — Encounter (HOSPITAL_COMMUNITY): Payer: PPO

## 2023-02-27 ENCOUNTER — Encounter (HOSPITAL_COMMUNITY): Payer: PPO

## 2023-02-27 DIAGNOSIS — M25571 Pain in right ankle and joints of right foot: Secondary | ICD-10-CM | POA: Diagnosis not present

## 2023-02-28 DIAGNOSIS — D62 Acute posthemorrhagic anemia: Secondary | ICD-10-CM | POA: Diagnosis not present

## 2023-02-28 DIAGNOSIS — D500 Iron deficiency anemia secondary to blood loss (chronic): Secondary | ICD-10-CM | POA: Diagnosis not present

## 2023-03-01 ENCOUNTER — Telehealth (HOSPITAL_COMMUNITY): Payer: Self-pay | Admitting: *Deleted

## 2023-03-01 ENCOUNTER — Encounter (HOSPITAL_COMMUNITY): Payer: PPO

## 2023-03-04 ENCOUNTER — Encounter (HOSPITAL_COMMUNITY): Payer: PPO

## 2023-03-06 ENCOUNTER — Encounter (HOSPITAL_COMMUNITY): Payer: PPO

## 2023-03-08 ENCOUNTER — Encounter (HOSPITAL_COMMUNITY): Payer: PPO

## 2023-03-11 ENCOUNTER — Encounter (HOSPITAL_COMMUNITY): Payer: PPO

## 2023-03-12 DIAGNOSIS — I351 Nonrheumatic aortic (valve) insufficiency: Secondary | ICD-10-CM | POA: Diagnosis not present

## 2023-03-12 DIAGNOSIS — Z952 Presence of prosthetic heart valve: Secondary | ICD-10-CM | POA: Diagnosis not present

## 2023-03-13 ENCOUNTER — Encounter (HOSPITAL_COMMUNITY): Payer: PPO

## 2023-03-15 ENCOUNTER — Encounter (HOSPITAL_COMMUNITY): Payer: PPO

## 2023-03-18 ENCOUNTER — Encounter (HOSPITAL_COMMUNITY): Payer: PPO

## 2023-03-20 ENCOUNTER — Encounter (HOSPITAL_COMMUNITY): Payer: PPO

## 2023-03-22 ENCOUNTER — Encounter (HOSPITAL_COMMUNITY): Payer: PPO

## 2023-03-25 ENCOUNTER — Encounter (HOSPITAL_COMMUNITY): Payer: PPO

## 2023-03-27 ENCOUNTER — Encounter (HOSPITAL_COMMUNITY): Payer: PPO

## 2023-03-28 DIAGNOSIS — D500 Iron deficiency anemia secondary to blood loss (chronic): Secondary | ICD-10-CM | POA: Diagnosis not present

## 2023-03-28 DIAGNOSIS — I10 Essential (primary) hypertension: Secondary | ICD-10-CM | POA: Diagnosis not present

## 2023-03-28 DIAGNOSIS — I350 Nonrheumatic aortic (valve) stenosis: Secondary | ICD-10-CM | POA: Diagnosis not present

## 2023-03-28 DIAGNOSIS — D6800 Von Willebrand disease, unspecified: Secondary | ICD-10-CM | POA: Diagnosis not present

## 2023-03-28 DIAGNOSIS — E782 Mixed hyperlipidemia: Secondary | ICD-10-CM | POA: Diagnosis not present

## 2023-03-29 ENCOUNTER — Encounter (HOSPITAL_COMMUNITY): Payer: PPO

## 2023-04-01 ENCOUNTER — Encounter (HOSPITAL_COMMUNITY): Payer: PPO

## 2023-04-03 ENCOUNTER — Encounter (HOSPITAL_COMMUNITY): Payer: PPO

## 2023-04-05 ENCOUNTER — Encounter (HOSPITAL_COMMUNITY): Payer: PPO

## 2023-04-05 DIAGNOSIS — R9431 Abnormal electrocardiogram [ECG] [EKG]: Secondary | ICD-10-CM | POA: Diagnosis not present

## 2023-04-08 ENCOUNTER — Encounter (HOSPITAL_COMMUNITY): Payer: PPO

## 2023-04-10 ENCOUNTER — Encounter (HOSPITAL_COMMUNITY): Payer: PPO

## 2023-04-12 ENCOUNTER — Encounter (HOSPITAL_COMMUNITY): Payer: PPO

## 2023-04-15 ENCOUNTER — Encounter (HOSPITAL_COMMUNITY): Payer: PPO

## 2023-04-17 ENCOUNTER — Encounter (HOSPITAL_COMMUNITY): Payer: PPO

## 2023-04-19 ENCOUNTER — Encounter (HOSPITAL_COMMUNITY): Payer: PPO

## 2023-04-24 ENCOUNTER — Encounter (HOSPITAL_COMMUNITY): Payer: PPO

## 2023-04-24 DIAGNOSIS — D500 Iron deficiency anemia secondary to blood loss (chronic): Secondary | ICD-10-CM | POA: Diagnosis not present

## 2023-04-24 DIAGNOSIS — D509 Iron deficiency anemia, unspecified: Secondary | ICD-10-CM | POA: Diagnosis not present

## 2023-04-26 ENCOUNTER — Encounter (HOSPITAL_COMMUNITY): Payer: PPO

## 2023-05-01 DIAGNOSIS — S0502XA Injury of conjunctiva and corneal abrasion without foreign body, left eye, initial encounter: Secondary | ICD-10-CM | POA: Diagnosis not present

## 2023-05-01 DIAGNOSIS — X58XXXA Exposure to other specified factors, initial encounter: Secondary | ICD-10-CM | POA: Diagnosis not present

## 2023-05-02 DIAGNOSIS — I10 Essential (primary) hypertension: Secondary | ICD-10-CM | POA: Diagnosis not present

## 2023-05-02 DIAGNOSIS — E782 Mixed hyperlipidemia: Secondary | ICD-10-CM | POA: Diagnosis not present

## 2023-05-09 DIAGNOSIS — D6800 Von Willebrand disease, unspecified: Secondary | ICD-10-CM | POA: Diagnosis not present

## 2023-05-09 DIAGNOSIS — E782 Mixed hyperlipidemia: Secondary | ICD-10-CM | POA: Diagnosis not present

## 2023-05-09 DIAGNOSIS — I350 Nonrheumatic aortic (valve) stenosis: Secondary | ICD-10-CM | POA: Diagnosis not present

## 2023-05-09 DIAGNOSIS — I10 Essential (primary) hypertension: Secondary | ICD-10-CM | POA: Diagnosis not present

## 2023-05-09 DIAGNOSIS — D500 Iron deficiency anemia secondary to blood loss (chronic): Secondary | ICD-10-CM | POA: Diagnosis not present

## 2023-05-10 DIAGNOSIS — D509 Iron deficiency anemia, unspecified: Secondary | ICD-10-CM | POA: Diagnosis not present

## 2023-05-17 DIAGNOSIS — D509 Iron deficiency anemia, unspecified: Secondary | ICD-10-CM | POA: Diagnosis not present

## 2023-06-25 ENCOUNTER — Ambulatory Visit: Payer: PPO | Admitting: Sports Medicine

## 2023-06-25 VITALS — BP 118/60 | Ht 60.0 in | Wt 143.0 lb

## 2023-06-25 DIAGNOSIS — M7670 Peroneal tendinitis, unspecified leg: Secondary | ICD-10-CM | POA: Diagnosis not present

## 2023-06-25 NOTE — Progress Notes (Signed)
   Subjective:    Patient ID: Alexis [name], female    DOB: [birth year], 72 y.o.   MRN: [id number]  HPI chief complaint: Bilateral foot pain  Very pleasant 72 year old female comes in today complaining of bilateral foot pain, right greater than left.  Right foot pain has been present for about 6 months.  No injury that she can recall.  Her pain did acutely worsen after attending a single cardiac rehab session which had her doing quite a bit of walking.  She did get some swelling at that time.  She applied ice which was helpful.  However, she has now developed pain in the same spot in the left foot.  She has pain when walking.  She has tried topical Voltaren without much relief.  Tylenol also has not provided much relief.  She has seen Dr. Dion Saucier a couple of times for this.  X-rays at his office were negative for bony abnormality per her report.  Past medical history reviewed Medications reviewed Allergies reviewed    Review of Systems As above    Objective:   Physical Exam  Well-developed, well-nourished.  No acute distress  Examination of both feet shows no significant tenderness to palpation.  No obvious soft tissue swelling.  There is reproducible pain with active foot eversion bilaterally.  Pain is localized along the course of the peroneal tendons.  No ankle effusion.  Good pulses.  Examination of her feet show fairly well-preserved arches and gait evaluation shows a fairly neutral gait.  Limited MSK ultrasound evaluation of the more symptomatic left foot does show fluid around the peroneus longus and brevis tendons just distal to the lateral malleolus.  Fluid continues past the peroneal tubercle along the course of the peroneus brevis to the attachment at the base of the fifth metatarsal.      Assessment & Plan:   Bilateral peroneal tendinopathy, right greater than left  Patient's gait is fairly neutral so I do not think any sort of orthotic or insert is necessary.  I did  recommend that she wear supportive shoes until her pain improves.  [facility] will [facility] at [facility] and will wean to a home exercise program per their discretion.  I explained that this type of tendinopathy takes weeks before resolving.  I recommended that she try capsaicin topically if Voltaren is ineffective.  She will follow-up for ongoing or recalcitrant issues.  This note was dictated using Dragon naturally speaking software and may contain errors in syntax, spelling, or content which have not been identified prior to signing this note.

## 2023-07-01 DIAGNOSIS — M25579 Pain in unspecified ankle and joints of unspecified foot: Secondary | ICD-10-CM | POA: Diagnosis not present

## 2023-07-06 DIAGNOSIS — S92534A Nondisplaced fracture of distal phalanx of right lesser toe(s), initial encounter for closed fracture: Secondary | ICD-10-CM | POA: Diagnosis not present

## 2023-07-12 DIAGNOSIS — S92534D Nondisplaced fracture of distal phalanx of right lesser toe(s), subsequent encounter for fracture with routine healing: Secondary | ICD-10-CM | POA: Diagnosis not present

## 2023-07-17 DIAGNOSIS — I10 Essential (primary) hypertension: Secondary | ICD-10-CM | POA: Diagnosis not present

## 2023-07-17 DIAGNOSIS — E890 Postprocedural hypothyroidism: Secondary | ICD-10-CM | POA: Diagnosis not present

## 2023-07-17 DIAGNOSIS — E785 Hyperlipidemia, unspecified: Secondary | ICD-10-CM | POA: Diagnosis not present

## 2023-07-17 DIAGNOSIS — Z0000 Encounter for general adult medical examination without abnormal findings: Secondary | ICD-10-CM | POA: Diagnosis not present

## 2023-07-24 DIAGNOSIS — E890 Postprocedural hypothyroidism: Secondary | ICD-10-CM | POA: Diagnosis not present

## 2023-07-24 DIAGNOSIS — E782 Mixed hyperlipidemia: Secondary | ICD-10-CM | POA: Diagnosis not present

## 2023-07-24 DIAGNOSIS — I10 Essential (primary) hypertension: Secondary | ICD-10-CM | POA: Diagnosis not present

## 2023-07-24 DIAGNOSIS — K922 Gastrointestinal hemorrhage, unspecified: Secondary | ICD-10-CM | POA: Diagnosis not present

## 2023-07-24 DIAGNOSIS — D500 Iron deficiency anemia secondary to blood loss (chronic): Secondary | ICD-10-CM | POA: Diagnosis not present

## 2023-07-24 DIAGNOSIS — D6800 Von Willebrand disease, unspecified: Secondary | ICD-10-CM | POA: Diagnosis not present

## 2023-07-24 DIAGNOSIS — I350 Nonrheumatic aortic (valve) stenosis: Secondary | ICD-10-CM | POA: Diagnosis not present

## 2023-07-24 DIAGNOSIS — M810 Age-related osteoporosis without current pathological fracture: Secondary | ICD-10-CM | POA: Diagnosis not present

## 2023-07-24 DIAGNOSIS — Z0000 Encounter for general adult medical examination without abnormal findings: Secondary | ICD-10-CM | POA: Diagnosis not present

## 2023-07-24 DIAGNOSIS — N182 Chronic kidney disease, stage 2 (mild): Secondary | ICD-10-CM | POA: Diagnosis not present

## 2023-07-25 DIAGNOSIS — I10 Essential (primary) hypertension: Secondary | ICD-10-CM | POA: Diagnosis not present

## 2023-07-25 DIAGNOSIS — E890 Postprocedural hypothyroidism: Secondary | ICD-10-CM | POA: Diagnosis not present

## 2023-07-25 DIAGNOSIS — E785 Hyperlipidemia, unspecified: Secondary | ICD-10-CM | POA: Diagnosis not present

## 2023-08-19 DIAGNOSIS — D6800 Von Willebrand disease, unspecified: Secondary | ICD-10-CM | POA: Diagnosis not present

## 2023-09-24 DIAGNOSIS — E890 Postprocedural hypothyroidism: Secondary | ICD-10-CM | POA: Diagnosis not present

## 2023-09-24 DIAGNOSIS — E785 Hyperlipidemia, unspecified: Secondary | ICD-10-CM | POA: Diagnosis not present

## 2023-09-24 DIAGNOSIS — I10 Essential (primary) hypertension: Secondary | ICD-10-CM | POA: Diagnosis not present

## 2023-09-24 DIAGNOSIS — Z0000 Encounter for general adult medical examination without abnormal findings: Secondary | ICD-10-CM | POA: Diagnosis not present

## 2023-11-14 DIAGNOSIS — I351 Nonrheumatic aortic (valve) insufficiency: Secondary | ICD-10-CM | POA: Diagnosis not present

## 2023-11-14 DIAGNOSIS — Z952 Presence of prosthetic heart valve: Secondary | ICD-10-CM | POA: Diagnosis not present

## 2023-11-14 DIAGNOSIS — Z953 Presence of xenogenic heart valve: Secondary | ICD-10-CM | POA: Diagnosis not present

## 2023-11-14 DIAGNOSIS — R9431 Abnormal electrocardiogram [ECG] [EKG]: Secondary | ICD-10-CM | POA: Diagnosis not present

## 2023-12-04 DIAGNOSIS — I10 Essential (primary) hypertension: Secondary | ICD-10-CM | POA: Diagnosis not present

## 2024-01-15 DIAGNOSIS — E890 Postprocedural hypothyroidism: Secondary | ICD-10-CM | POA: Diagnosis not present

## 2024-01-15 DIAGNOSIS — D6800 Von Willebrand disease, unspecified: Secondary | ICD-10-CM | POA: Diagnosis not present

## 2024-01-15 DIAGNOSIS — I10 Essential (primary) hypertension: Secondary | ICD-10-CM | POA: Diagnosis not present

## 2024-01-15 DIAGNOSIS — E782 Mixed hyperlipidemia: Secondary | ICD-10-CM | POA: Diagnosis not present

## 2024-01-15 DIAGNOSIS — N182 Chronic kidney disease, stage 2 (mild): Secondary | ICD-10-CM | POA: Diagnosis not present

## 2024-01-22 DIAGNOSIS — I10 Essential (primary) hypertension: Secondary | ICD-10-CM | POA: Diagnosis not present

## 2024-01-22 DIAGNOSIS — E782 Mixed hyperlipidemia: Secondary | ICD-10-CM | POA: Diagnosis not present

## 2024-01-22 DIAGNOSIS — N182 Chronic kidney disease, stage 2 (mild): Secondary | ICD-10-CM | POA: Diagnosis not present

## 2024-02-07 DIAGNOSIS — Z862 Personal history of diseases of the blood and blood-forming organs and certain disorders involving the immune mechanism: Secondary | ICD-10-CM | POA: Diagnosis not present

## 2024-02-07 DIAGNOSIS — K5751 Diverticulosis of both small and large intestine without perforation or abscess with bleeding: Secondary | ICD-10-CM | POA: Diagnosis not present

## 2024-03-04 DIAGNOSIS — L570 Actinic keratosis: Secondary | ICD-10-CM | POA: Diagnosis not present

## 2024-03-04 DIAGNOSIS — L738 Other specified follicular disorders: Secondary | ICD-10-CM | POA: Diagnosis not present

## 2024-03-18 DIAGNOSIS — N182 Chronic kidney disease, stage 2 (mild): Secondary | ICD-10-CM | POA: Diagnosis not present

## 2024-03-18 DIAGNOSIS — I10 Essential (primary) hypertension: Secondary | ICD-10-CM | POA: Diagnosis not present

## 2024-03-18 DIAGNOSIS — E782 Mixed hyperlipidemia: Secondary | ICD-10-CM | POA: Diagnosis not present

## 2024-03-25 DIAGNOSIS — N182 Chronic kidney disease, stage 2 (mild): Secondary | ICD-10-CM | POA: Diagnosis not present

## 2024-03-25 DIAGNOSIS — I10 Essential (primary) hypertension: Secondary | ICD-10-CM | POA: Diagnosis not present

## 2024-03-25 DIAGNOSIS — E782 Mixed hyperlipidemia: Secondary | ICD-10-CM | POA: Diagnosis not present

## 2024-04-01 DIAGNOSIS — D692 Other nonthrombocytopenic purpura: Secondary | ICD-10-CM | POA: Diagnosis not present

## 2024-04-01 DIAGNOSIS — D225 Melanocytic nevi of trunk: Secondary | ICD-10-CM | POA: Diagnosis not present

## 2024-04-01 DIAGNOSIS — L821 Other seborrheic keratosis: Secondary | ICD-10-CM | POA: Diagnosis not present

## 2024-04-01 DIAGNOSIS — L814 Other melanin hyperpigmentation: Secondary | ICD-10-CM | POA: Diagnosis not present

## 2024-04-15 DIAGNOSIS — Z961 Presence of intraocular lens: Secondary | ICD-10-CM | POA: Diagnosis not present

## 2024-04-15 DIAGNOSIS — G43109 Migraine with aura, not intractable, without status migrainosus: Secondary | ICD-10-CM | POA: Diagnosis not present

## 2024-04-15 DIAGNOSIS — H18513 Endothelial corneal dystrophy, bilateral: Secondary | ICD-10-CM | POA: Diagnosis not present

## 2024-04-15 DIAGNOSIS — H04123 Dry eye syndrome of bilateral lacrimal glands: Secondary | ICD-10-CM | POA: Diagnosis not present

## 2024-04-17 DIAGNOSIS — S63591A Other specified sprain of right wrist, initial encounter: Secondary | ICD-10-CM | POA: Diagnosis not present

## 2024-04-22 DIAGNOSIS — D509 Iron deficiency anemia, unspecified: Secondary | ICD-10-CM | POA: Diagnosis not present

## 2024-04-22 DIAGNOSIS — D6800 Von Willebrand disease, unspecified: Secondary | ICD-10-CM | POA: Diagnosis not present

## 2024-07-17 DIAGNOSIS — I10 Essential (primary) hypertension: Secondary | ICD-10-CM | POA: Diagnosis not present

## 2024-07-17 DIAGNOSIS — Z1159 Encounter for screening for other viral diseases: Secondary | ICD-10-CM | POA: Diagnosis not present

## 2024-07-17 DIAGNOSIS — Z23 Encounter for immunization: Secondary | ICD-10-CM | POA: Diagnosis not present

## 2024-07-17 DIAGNOSIS — Z13220 Encounter for screening for lipoid disorders: Secondary | ICD-10-CM | POA: Diagnosis not present

## 2024-07-17 DIAGNOSIS — Z1231 Encounter for screening mammogram for malignant neoplasm of breast: Secondary | ICD-10-CM | POA: Diagnosis not present

## 2024-07-17 DIAGNOSIS — R92323 Mammographic fibroglandular density, bilateral breasts: Secondary | ICD-10-CM | POA: Diagnosis not present

## 2024-07-17 DIAGNOSIS — C73 Malignant neoplasm of thyroid gland: Secondary | ICD-10-CM | POA: Diagnosis not present

## 2024-07-17 DIAGNOSIS — Z0000 Encounter for general adult medical examination without abnormal findings: Secondary | ICD-10-CM | POA: Diagnosis not present

## 2024-07-17 DIAGNOSIS — I350 Nonrheumatic aortic (valve) stenosis: Secondary | ICD-10-CM | POA: Diagnosis not present

## 2024-07-17 DIAGNOSIS — Z961 Presence of intraocular lens: Secondary | ICD-10-CM | POA: Diagnosis not present

## 2024-07-20 DIAGNOSIS — E782 Mixed hyperlipidemia: Secondary | ICD-10-CM | POA: Diagnosis not present

## 2024-07-24 DIAGNOSIS — E890 Postprocedural hypothyroidism: Secondary | ICD-10-CM | POA: Diagnosis not present

## 2024-07-24 DIAGNOSIS — E782 Mixed hyperlipidemia: Secondary | ICD-10-CM | POA: Diagnosis not present

## 2024-07-24 DIAGNOSIS — E559 Vitamin D deficiency, unspecified: Secondary | ICD-10-CM | POA: Diagnosis not present

## 2024-07-24 DIAGNOSIS — E785 Hyperlipidemia, unspecified: Secondary | ICD-10-CM | POA: Diagnosis not present

## 2024-09-22 ENCOUNTER — Emergency Department (HOSPITAL_COMMUNITY)

## 2024-09-22 ENCOUNTER — Emergency Department (HOSPITAL_COMMUNITY)
Admission: EM | Admit: 2024-09-22 | Discharge: 2024-09-22 | Disposition: A | Discharge status: Home / Self Care | Source: Ambulatory Visit | Attending: Emergency Medicine | Admitting: Emergency Medicine

## 2024-09-22 ENCOUNTER — Other Ambulatory Visit: Payer: Self-pay

## 2024-09-22 ENCOUNTER — Encounter (HOSPITAL_COMMUNITY): Payer: Self-pay | Admitting: Emergency Medicine

## 2024-09-22 DIAGNOSIS — R0602 Shortness of breath: Secondary | ICD-10-CM | POA: Insufficient documentation

## 2024-09-22 DIAGNOSIS — R531 Weakness: Secondary | ICD-10-CM | POA: Diagnosis not present

## 2024-09-22 DIAGNOSIS — R519 Headache, unspecified: Secondary | ICD-10-CM | POA: Diagnosis not present

## 2024-09-22 DIAGNOSIS — R42 Dizziness and giddiness: Secondary | ICD-10-CM | POA: Diagnosis present

## 2024-09-22 LAB — COMPREHENSIVE METABOLIC PANEL WITH GFR
ALT: 14 U/L (ref 0–44)
AST: 25 U/L (ref 15–41)
Albumin: 4.3 g/dL (ref 3.5–5.0)
Alkaline Phosphatase: 106 U/L (ref 38–126)
Anion gap: 9 (ref 5–15)
BUN: 12 mg/dL (ref 8–23)
CO2: 26 mmol/L (ref 22–32)
Calcium: 8.7 mg/dL — ABNORMAL LOW (ref 8.9–10.3)
Chloride: 105 mmol/L (ref 98–111)
Creatinine, Ser: 0.56 mg/dL (ref 0.44–1.00)
GFR, Estimated: >60 mL/min (ref >60)
Glucose, Bld: 91 mg/dL (ref 70–99)
Potassium: 3.4 mmol/L — ABNORMAL LOW (ref 3.5–5.1)
Sodium: 140 mmol/L (ref 135–145)
Total Bilirubin: 0.4 mg/dL (ref 0.0–1.2)
Total Protein: 7.1 g/dL (ref 6.5–8.1)

## 2024-09-22 LAB — CBC
HCT: 46.2 % — ABNORMAL HIGH (ref 36.0–46.0)
Hemoglobin: 15 g/dL (ref 12.0–15.0)
MCH: 28.7 pg (ref 26.0–34.0)
MCHC: 32.5 g/dL (ref 30.0–36.0)
MCV: 88.5 fL (ref 80.0–100.0)
Platelets: 313 K/uL (ref 150–400)
RBC: 5.22 MIL/uL — ABNORMAL HIGH (ref 3.87–5.11)
RDW: 13.8 % (ref 11.5–15.5)
WBC: 6.4 K/uL (ref 4.0–10.5)
nRBC: 0 % (ref 0.0–0.2)

## 2024-09-22 LAB — TROPONIN T, HIGH SENSITIVITY: Troponin T High Sensitivity: <15 ng/L (ref 0–19)

## 2024-09-22 LAB — PRO BRAIN NATRIURETIC PEPTIDE: Pro Brain Natriuretic Peptide: 96 pg/mL (ref <300.0)

## 2024-09-22 MED ORDER — MECLIZINE HCL 12.5 MG PO TABS: 12.5000 mg | ORAL_TABLET | Freq: Three times a day (TID) | ORAL | 0 refills | Status: AC | PRN

## 2024-09-22 MED ORDER — ONDANSETRON 4 MG PO TBDP: 4.0000 mg | ORAL_TABLET | Freq: Three times a day (TID) | ORAL | 0 refills | Status: AC | PRN

## 2024-09-22 MED ORDER — MECLIZINE HCL 25 MG PO TABS
12.5000 mg | ORAL_TABLET | Freq: Once | ORAL | Status: AC
  Administered 2024-09-22: 12.5 mg via ORAL
  Filled 2024-09-22: qty 1

## 2024-09-22 NOTE — ED Provider Notes (Signed)
 [place] EMERGENCY DEPARTMENT AT [facility] Provider Note   CSN: [id number] Arrival date & time: 09/22/24  1714     Patient presents with: Dizziness   Alexis [name] is a 73 y.o. female.   Patient here with dizziness.  Concerned about high blood pressure.  She has been dizzy on and off for several weeks.  She was feel little short of breath today and concerned about her heart valve.  She has a history of von Willebrand's disease.  History of breast and thyroid  cancer.  She has been feeling intermittently dizzy with change of position here the last few weeks.  She has been ambulatory.  Was maybe a little bit worse today.  Got nervous when her blood pressure was elevated in the 200s systolic.  She denies headache.  She denies any weakness numbness tingling.  No vision loss.  No coordination issues.  Is not having any chest pain.  She denies any leg swelling.  She denies any hearing loss or ringing in the ear.  No vertigo history.  The history is provided by the patient.       Prior to Admission medications   Medication Sig Start Date End Date Taking? Authorizing Provider  meclizine (ANTIVERT) 12.5 MG tablet Take 1 tablet (12.5 mg total) by mouth 3 (three) times daily as needed for dizziness. 09/22/24  Yes Ahnya Akre, DO  ondansetron  (ZOFRAN -ODT) 4 MG disintegrating tablet Take 1 tablet (4 mg total) by mouth every 8 (eight) hours as needed for nausea or vomiting. 09/22/24  Yes Assunta Pupo, DO  amoxicillin (AMOXIL) 500 MG capsule Take 500 mg by mouth as needed (take 4 capsules 30-60 minutes prior to dental procedure). 09/14/22   [provider]  Calcium Citrate-Vitamin D 315-5 MG-MCG TABS Take 1 tablet by mouth daily. 12/07/22   [provider]  Cholecalciferol 50 MCG (2000 UT) CAPS Take 1 capsule by mouth daily. 03/26/22   [provider]  levothyroxine  (SYNTHROID , LEVOTHROID) 125 MCG tablet Take 125 mcg by mouth daily before breakfast.     [provider]  Multiple Vitamin (MULTI-VITAMIN) tablet Take 1 tablet by mouth daily.    [provider]  Polyethyl Glycol-Propyl Glycol (SYSTANE) 0.4-0.3 % GEL ophthalmic gel Place 0.3 Applications into both eyes as needed (as needed). 09/19/20   [provider]  Von Willebrand Factor , Recomb, (VONVENDI ) 1300 units SOLR Inject 1,300 Units into the vein once a week. Administered once a week 11/16/22   [provider]    Allergies: Iron    Review of Systems  Updated Vital Signs BP (!) 164/114   Pulse 69   Temp 98.5 F (36.9 C) (Oral)   Resp 10   LMP  (LMP Unknown)   SpO2 96%   Physical Exam Vitals and nursing note reviewed.  Constitutional:      General: She is not in acute distress.    Appearance: She is well-developed. She is not ill-appearing.  HENT:     Head: Normocephalic and atraumatic.     Nose: Nose normal.     Mouth/Throat:     Mouth: Mucous membranes are moist.  Eyes:     Extraocular Movements: Extraocular movements intact.     Conjunctiva/sclera: Conjunctivae normal.     Pupils: Pupils are equal, round, and reactive to light.  Cardiovascular:     Rate and Rhythm: Normal rate and regular rhythm.     Pulses: Normal pulses.     Heart sounds: Normal heart sounds. No  murmur heard. Pulmonary:     Effort: Pulmonary effort is normal. No respiratory distress.     Breath sounds: Normal breath sounds.  Abdominal:     General: Abdomen is flat.     Palpations: Abdomen is soft.     Tenderness: There is no abdominal tenderness.  Musculoskeletal:        General: No swelling.     Cervical back: Normal range of motion and neck supple.     Right lower leg: No edema.     Left lower leg: No edema.  Skin:    General: Skin is warm and dry.     Capillary Refill: Capillary refill takes less than 2 seconds.  Neurological:     General: No focal deficit present.     Mental Status: She is alert and oriented to person, place, and time.      Cranial Nerves: No cranial nerve deficit.     Sensory: No sensory deficit.     Motor: No weakness.     Coordination: Coordination normal.     Gait: Gait normal.     Comments: No nystagmus, normal visual fields, normal coordination with normal finger-nose-finger, normal gait, 5+ out of 5 strength throughout, normal sensation  Psychiatric:        Mood and Affect: Mood normal.     (all labs ordered are listed, but only abnormal results are displayed) Labs Reviewed  CBC - Abnormal; Notable for the following components:      Result Value   RBC 5.22 (*)    HCT 46.2 (*)    All other components within normal limits  COMPREHENSIVE METABOLIC PANEL WITH GFR - Abnormal; Notable for the following components:   Potassium 3.4 (*)    Calcium 8.7 (*)    All other components within normal limits  PRO BRAIN NATRIURETIC PEPTIDE  TROPONIN T, HIGH SENSITIVITY    EKG: EKG Interpretation Date/Time:  Tuesday September 22 2024 20:17:19 EDT Ventricular Rate:  60 PR Interval:  177 QRS Duration:  91 QT Interval:  474 QTC Calculation: 474 R Axis:   59  Text Interpretation: Sinus rhythm Confirmed by Ruthe Cornet [phone]) on 09/22/2024 8:19:34 PM  Radiology: CT Head Wo Contrast Result Date: 09/22/2024 EXAM: CT HEAD WITHOUT CONTRAST 09/22/2024 06:34:13 PM TECHNIQUE: CT of the head was performed without the administration of intravenous contrast. Automated exposure control, iterative reconstruction, and/or weight based adjustment of the mA/kV was utilized to reduce the radiation dose to as low as reasonably achievable. COMPARISON: None available. CLINICAL HISTORY: Headache, increasing frequency or severity. Pt reports dizziness for a couple of weeks, SHOB on exertion, and headache. FINDINGS: BRAIN AND VENTRICLES: No acute hemorrhage. No evidence of acute infarct. No hydrocephalus. No extra-axial collection. No mass effect or midline shift. ORBITS: Bilateral lens replacement. SINUSES: No acute abnormality. SOFT  TISSUES AND SKULL: No acute soft tissue abnormality. No skull fracture. IMPRESSION: 1. No acute intracranial abnormality. Electronically signed by: Morene Hoard MD 09/22/2024 06:54 PM EDT RP Workstation: [id number]   DG Chest 2 View Result Date: 09/22/2024 CLINICAL DATA:  Weakness.  Dizziness. EXAM: CHEST - 2 VIEW COMPARISON:  12/06/2021 FINDINGS: TAVR.The cardiomediastinal contours are normal. The lungs are clear. Pulmonary vasculature is normal. No consolidation, pleural effusion, or pneumothorax. No acute osseous abnormalities are seen. IMPRESSION: No active cardiopulmonary disease. Electronically Signed   By: Andrea Gasman M.D.   On: 09/22/2024 18:49     Procedures   Medications Ordered in the ED  meclizine (ANTIVERT) tablet 12.5 mg (  12.5 mg Oral Given 09/22/24 2041)                                    Medical Decision Making Amount and/or Complexity of Data Reviewed Labs: ordered. Radiology: ordered.  Risk Prescription drug management.   Alexis [name] is here with dizziness shortness of breath.  Unremarkable vitals.  Blood pressure 160/90 on my evaluation.  Was elevated when she first came in.  She does admit that she is worried about maybe valvular heart problem.  She had a heart valve replacement.  But she is feeling dizzy short of breath today.  She has been dealing with some dizziness for the last several weeks that comes and goes with movement.  Mostly her symptoms have resolved.  She has no signs of volume overload on exam.  She has a normal neurological exam.  I do not have any concern for stroke.  She has no visual field deficit.  Normal coordination.  Normal finger-to-nose finger.  She looks very comfortable.  Clear breath sounds.  Will give her a dose of meclizine for possibly vertigo causing her intermittent dizziness here the last several weeks.  Will evaluate for any cardiac pulmonary process with chest x-ray basic labs troponin BNP.  Her vitals are reassuring.   I do not think she has a blood clot.  She has von Willebrand's disease valvular heart replacement.  Patient was ambulatory in the room without any issues.  Overall lab work is unremarkable.  Is no significant leukocytosis anemia or electrolyte abnormality.  Troponin normal.  BNP unremarkable.  No signs of volume overload on chest x-ray.  Head CT is unremarkable.  Overall do suspect may be some peripheral vertigo type process.  Will prescribe meclizine and Zofran .  I have no concern for valvular heart issue emergency or heart failure.  I have low suspicion for stroke given the length of her symptoms and how her symptoms seem more peripheral in origin.  She understands return precautions.  Close follow-up with primary care.  Discharged in good condition.  This chart was dictated using voice recognition software.  Despite best efforts to proofread,  errors can occur which can change the documentation meaning.      Final diagnoses:  Dizziness  SOB (shortness of breath)    ED Discharge Orders          Ordered    meclizine (ANTIVERT) 12.5 MG tablet  3 times daily PRN        09/22/24 2019    ondansetron  (ZOFRAN -ODT) 4 MG disintegrating tablet  Every 8 hours PRN        09/22/24 2019               Ruthe Cornet, DO 09/22/24 2248

## 2024-09-22 NOTE — ED Triage Notes (Signed)
 Pt reports dizziness for a couple of weeks, SHOB on exertion, and headache.

## 2024-09-22 NOTE — Discharge Instructions (Signed)
 Take meclizine as needed for dizziness.  Take Zofran  as needed for nausea and vomiting.  Follow-up with your primary care doctor.  Return if symptoms worsen.

## 2024-09-24 DIAGNOSIS — R42 Dizziness and giddiness: Secondary | ICD-10-CM | POA: Diagnosis not present

## 2024-09-24 DIAGNOSIS — I10 Essential (primary) hypertension: Secondary | ICD-10-CM | POA: Diagnosis not present

## 2024-11-02 DIAGNOSIS — R5381 Other malaise: Secondary | ICD-10-CM | POA: Diagnosis not present

## 2024-11-02 DIAGNOSIS — Z79899 Other long term (current) drug therapy: Secondary | ICD-10-CM | POA: Diagnosis not present

## 2024-11-02 DIAGNOSIS — R200 Anesthesia of skin: Secondary | ICD-10-CM | POA: Diagnosis not present

## 2024-11-02 DIAGNOSIS — Z13220 Encounter for screening for lipoid disorders: Secondary | ICD-10-CM | POA: Diagnosis not present

## 2024-11-02 DIAGNOSIS — R202 Paresthesia of skin: Secondary | ICD-10-CM | POA: Diagnosis not present

## 2024-11-02 DIAGNOSIS — I10 Essential (primary) hypertension: Secondary | ICD-10-CM | POA: Diagnosis not present

## 2024-11-12 ENCOUNTER — Institutional Professional Consult (permissible substitution) (INDEPENDENT_AMBULATORY_CARE_PROVIDER_SITE_OTHER): Admitting: Otolaryngology
